# Patient Record
Sex: Female | Born: 1965
Health system: Southern US, Community
[De-identification: ages and names within clinical notes are randomized; demographics above are authoritative.]

---

## 2001-01-22 ENCOUNTER — Emergency Department (HOSPITAL_COMMUNITY): Admission: EM | Admit: 2001-01-22 | Discharge: 2001-01-22 | Payer: Self-pay | Admitting: Emergency Medicine

## 2006-04-26 ENCOUNTER — Other Ambulatory Visit: Admission: RE | Admit: 2006-04-26 | Discharge: 2006-04-26 | Payer: Self-pay | Admitting: Family Medicine

## 2007-04-13 ENCOUNTER — Emergency Department (HOSPITAL_COMMUNITY): Admission: EM | Admit: 2007-04-13 | Discharge: 2007-04-13 | Payer: Self-pay | Admitting: Emergency Medicine

## 2007-06-09 ENCOUNTER — Other Ambulatory Visit: Admission: RE | Admit: 2007-06-09 | Discharge: 2007-06-09 | Payer: Self-pay | Admitting: Family Medicine

## 2007-09-30 ENCOUNTER — Ambulatory Visit (HOSPITAL_COMMUNITY): Admission: RE | Admit: 2007-09-30 | Discharge: 2007-09-30 | Payer: Self-pay | Admitting: Obstetrics and Gynecology

## 2008-05-25 ENCOUNTER — Inpatient Hospital Stay (HOSPITAL_COMMUNITY): Admission: RE | Admit: 2008-05-25 | Discharge: 2008-05-27 | Payer: Self-pay | Admitting: Obstetrics and Gynecology

## 2009-10-02 ENCOUNTER — Emergency Department (HOSPITAL_COMMUNITY): Admission: EM | Admit: 2009-10-02 | Discharge: 2009-10-02 | Payer: Self-pay | Admitting: Emergency Medicine

## 2010-11-23 LAB — CBC
HCT: 38.4 % (ref 36.0–46.0)
Hemoglobin: 12.9 g/dL (ref 12.0–15.0)
MCHC: 33.6 g/dL (ref 30.0–36.0)
MCV: 90.5 fL (ref 78.0–100.0)
RBC: 4.24 MIL/uL (ref 3.87–5.11)
WBC: 4.5 10*3/uL (ref 4.0–10.5)

## 2010-11-23 LAB — POCT I-STAT, CHEM 8
Calcium, Ion: 1.08 mmol/L — ABNORMAL LOW (ref 1.12–1.32)
Hemoglobin: 14.3 g/dL (ref 12.0–15.0)
TCO2: 28 mmol/L (ref 0–100)

## 2010-11-23 LAB — DIFFERENTIAL
Lymphocytes Relative: 34 % (ref 12–46)
Monocytes Absolute: 0.4 10*3/uL (ref 0.1–1.0)
Monocytes Relative: 8 % (ref 3–12)
Neutro Abs: 2.5 10*3/uL (ref 1.7–7.7)

## 2010-11-23 LAB — POCT CARDIAC MARKERS
CKMB, poc: <1 ng/mL — ABNORMAL LOW (ref 1.0–8.0)
Myoglobin, poc: 65.4 ng/mL (ref 12–200)
Troponin i, poc: <0.05 ng/mL (ref 0.00–0.09)

## 2011-01-20 NOTE — H&P (Signed)
Melinda Graham, RYBACK                 ACCOUNT NO.:  [id number]   MEDICAL RECORD NO.:  [id number]        PATIENT TYPE:  WINP   LOCATION:                                FACILITY:  [facility]   PHYSICIAN:  Sherron Monday, MD        DATE OF BIRTH:  1965-12-28   DATE OF ADMISSION:  05/25/2008  DATE OF DISCHARGE:                              HISTORY & PHYSICAL   ADMITTING DIAGNOSES:  Intrauterine pregnancy at term, favorable cervix,  advanced maternal age.   PROCEDURES:  Induction of labor.   HISTORY OF PRESENT ILLNESS:  A 45 year old Caucasian G1, P0 at 58 and 3  with poor induction of labor secondary to favorable cervix.  She has  good fetal movements and loss of fluid.  No vaginal bleeding and  occasional contractions.  Her cervix in the office on May 23, 2008, was 4 cm dilated 50% effacement, and -2 station.  Her prenatal  care has been relatively uncomplicated.   PAST MEDICAL HISTORY:  Significant for situational anxiety.   PAST SURGICAL HISTORY:  Significant for LEEP and a breast augmentation  and also tummy tuck.   PAST OB/GYN HISTORY:  G1 is present pregnancy.  She has a history of an  abnormal Pap smear and colpo and LEAP 15 years ago also colposcopy in  2008 with biopsy.  She has also has a history of herpes and has been  getting suppression in her pregnancy.   MEDICATIONS:  Include prenatal vitamins.   ALLERGIES:  No known drug allergies.   SOCIAL HISTORY:  The patient denies tobacco, alcohol , or drug use; has  in the past been an alcohol user.   FAMILY HISTORY:  Significant for hypertension in her sister and diabetes  in maternal grandfather, maternal uncle, and maternal great uncle as  well as thyroid dysfunction in her sister.   PRENATAL LABS:  Hemoglobin 14.0 and platelets 200,000.  She is A  positive.  Antibody screen negative.  Group B strep in her urine.  AFB  seems to be normal.  Gonorrhea negative.  Chlamydia negative.  Herpes  positive for type 1 and 2,  RPR  nonreactive.  Rubella immune.  Cystic  fibrosis screen negative.  Hepatitis C surface antigen negative.  HIV  screen was negative.  First trimester screening within limits with a  normal nipple fold.  A 18-week scan revealed shadow on the baby's  heart therefore she had an abnormal fetal echo.  Ultrasound in her  prenatal care at 18 weeks revealed or confirmed an [facility] of May 29, 2008, normal anatomy and a anterior placenta with the normal anatomy, it  was noted that an echogenic band was seen in the left ventricle.  She  was advised to get an fetal echo, which was performed without difficulty  and revealed normal anatomy on Jan 10, 2008.  On fundus scan had a  slightly short cervix, mild funneling.  She had a fetal fibronectin and  the cervical links and was on pelvic rest following this.  Fetal  fibronectin was positive.   PHYSICAL EXAMINATION:  VITAL SIGNS:  Afebrile. Vital signs stable.  GENERAL:  No apparent distress.  CARDIOVASCULAR:  Regular rate and rhythm.  LUNGS:  Clear to auscultation bilaterally.  ABDOMEN:  Soft, nontender, and fundus nontender.  CERVIX:  Asymmetric and nontender.  Fetal heart tones seen in the office  were 140s.  Vaginal exam is 4, 58, and -2.   ASSESSMENT AND PLAN:  A 44 year old G1 admitted for induction of labor  after discussing risks, benefits, and alternatives, wishes to proceed.  We will rupture her membrane, and then start her on Pitocin.      Sherron Monday, MD  Electronically Signed     JB/MEDQ  D:  05/24/2008  T:  05/25/2008  Job:  [id number]

## 2011-01-20 NOTE — Discharge Summary (Signed)
Melinda Graham, SALMI                 ACCOUNT NO.:  [id number]   MEDICAL RECORD NO.:  [id number]          PATIENT TYPE:  INP   LOCATION:  9120                          FACILITY:  [facility]   PHYSICIAN:  Sherron Monday, MD        DATE OF BIRTH:  1966/06/23   DATE OF ADMISSION:  05/25/2008  DATE OF DISCHARGE:  05/27/2008                               DISCHARGE SUMMARY   ADMITTING DIAGNOSES:  Intrauterine pregnancy at term, favorable cervix,  advanced maternal age.   DISCHARGE DIAGNOSIS:  Intrauterine pregnancy at term, favorable cervix,  advanced maternal age, delivered via spontaneous vaginal delivery.   HISTORY OF PRESENT ILLNESS:  A 45 year old G1, P0 at 46 and 3/7 weeks  admitted for induction of labor secondary to favorable cervix.  She has  had good fetal movement and no loss of fluid, no vaginal bleeding, and  occasional contractions.  Presents to the office on May 23, 2008,  was 4-cm dilation, 50% effaced, and -2 station.  Her prenatal care has  been relatively uncomplicated.   PAST MEDICAL HISTORY:  Significant for situational anxiety.   PAST SURGICAL HISTORY:  Significant for LEEP, breast augmentation, and  tummy tuck.   PAST OB/GYN HISTORY:  G1 is the present pregnancy, history of abnormal  Pap smear, colpo and LEEP 15 years ago with a nonspecific colposcopy in  2008, with biopsy and a history of herpes and has been getting  suppression with her pregnancy.   MEDICATIONS:  Prenatal vitamins.   ALLERGIES:  No known drug allergies.   SOCIAL HISTORY:  The patient denies alcohol, tobacco, or drug use but  has in the past been an alcohol user.   FAMILY HISTORY:  Significant for hypertension in sister, and diabetes in  maternal grandfather, maternal uncle, and maternal great uncle as well  as thyroid dysfunction in her sister.   PRENATAL LABORATORY:  Hemoglobin 14, platelets 200,000. A positive,  antibody screen negative.  Group B strep negative.  Antibody AFP is  normal.   Gonorrhea negative.  Chlamydia negative.  Herpes, she is  positive for type 1 and 2.  RPR nonreactive.  Rubella immune.  Cystic  fibrosis screen negative.  Hepatitis B surface antigen negative.  HIV is  negative.  First trimester screen within normal limits and normal nuchal  fold.   Ultrasound 18 weeks revealed shadow on the baby's heart, therefore she  had a fetal echo which was found to be normal.  The 18-week scanning  confirmed an [facility] of May 29, 2008, normal anatomy anterior  placenta.  On a  4-D ultrasound, she has a slightly shortened cervix and  moderate funneling and has a fetal fibronectin test.  She is put on  pelvic rest, fetal fibronectin was positive.  However, there were no  problems until this point.   On admission today afebrile, vital signs stable with a very benign exam.  Once she was admitted, she was 4-cm dilated and 50% effaced and -2  station. She was admitted for labor, started on Pitocin foraugmentation.  She received her penicillin, rupture of membranes was  performed for  clear fluid.  She progressed to complete, complete and +2-3, pushed well  for about 10-15 minutes to deliver a viable female infant at 21:15 with  Apgars of 9 and 9.  Infant delivered from OA into LOT weight of 8 pounds  1 ounce.  Second degree perineal laceration and right labial laceration  were repaired with 3-0 Vicryl in typical fashion.  Placenta was  expressed intact at 21:21.  Her postpartum course was relatively  uncomplicated.  She remained afebrile with stable vital signs  throughout.  Her hemoglobin decreased from 13.8 to 11.6 which she is  tolerating well.  She was discharged to home on postpartum day 2 with  her pain being well controlled, normal lochia and no problems with  ambulation.  She was discharged to home with Motrin, Vicodin, and  prenatal vitamins.  She will follow up in approximately 6 weeks.  She is  given routine discharge instructions and numbers to call if  any  questions or problems.  She plans to breastfeed.  She is A positive,  rubella immune.  Hemoglobin decreased from 13.8 to a 11.6, and we will  discuss contraception at her 6-week checkup.      Sherron Monday, MD  Electronically Signed     JB/MEDQ  D:  05/27/2008  T:  05/28/2008  Job:  [id number]   cc:   Malva Limes, M.D.  Fax: [phone]

## 2011-02-03 IMAGING — CR DG CHEST 2V
2 series · 2 of 2 positions shown · non-contrast
Comparison: None.

CLINICAL DATA: Chest pressure.

CHEST - 2 VIEW

[w chest pa]
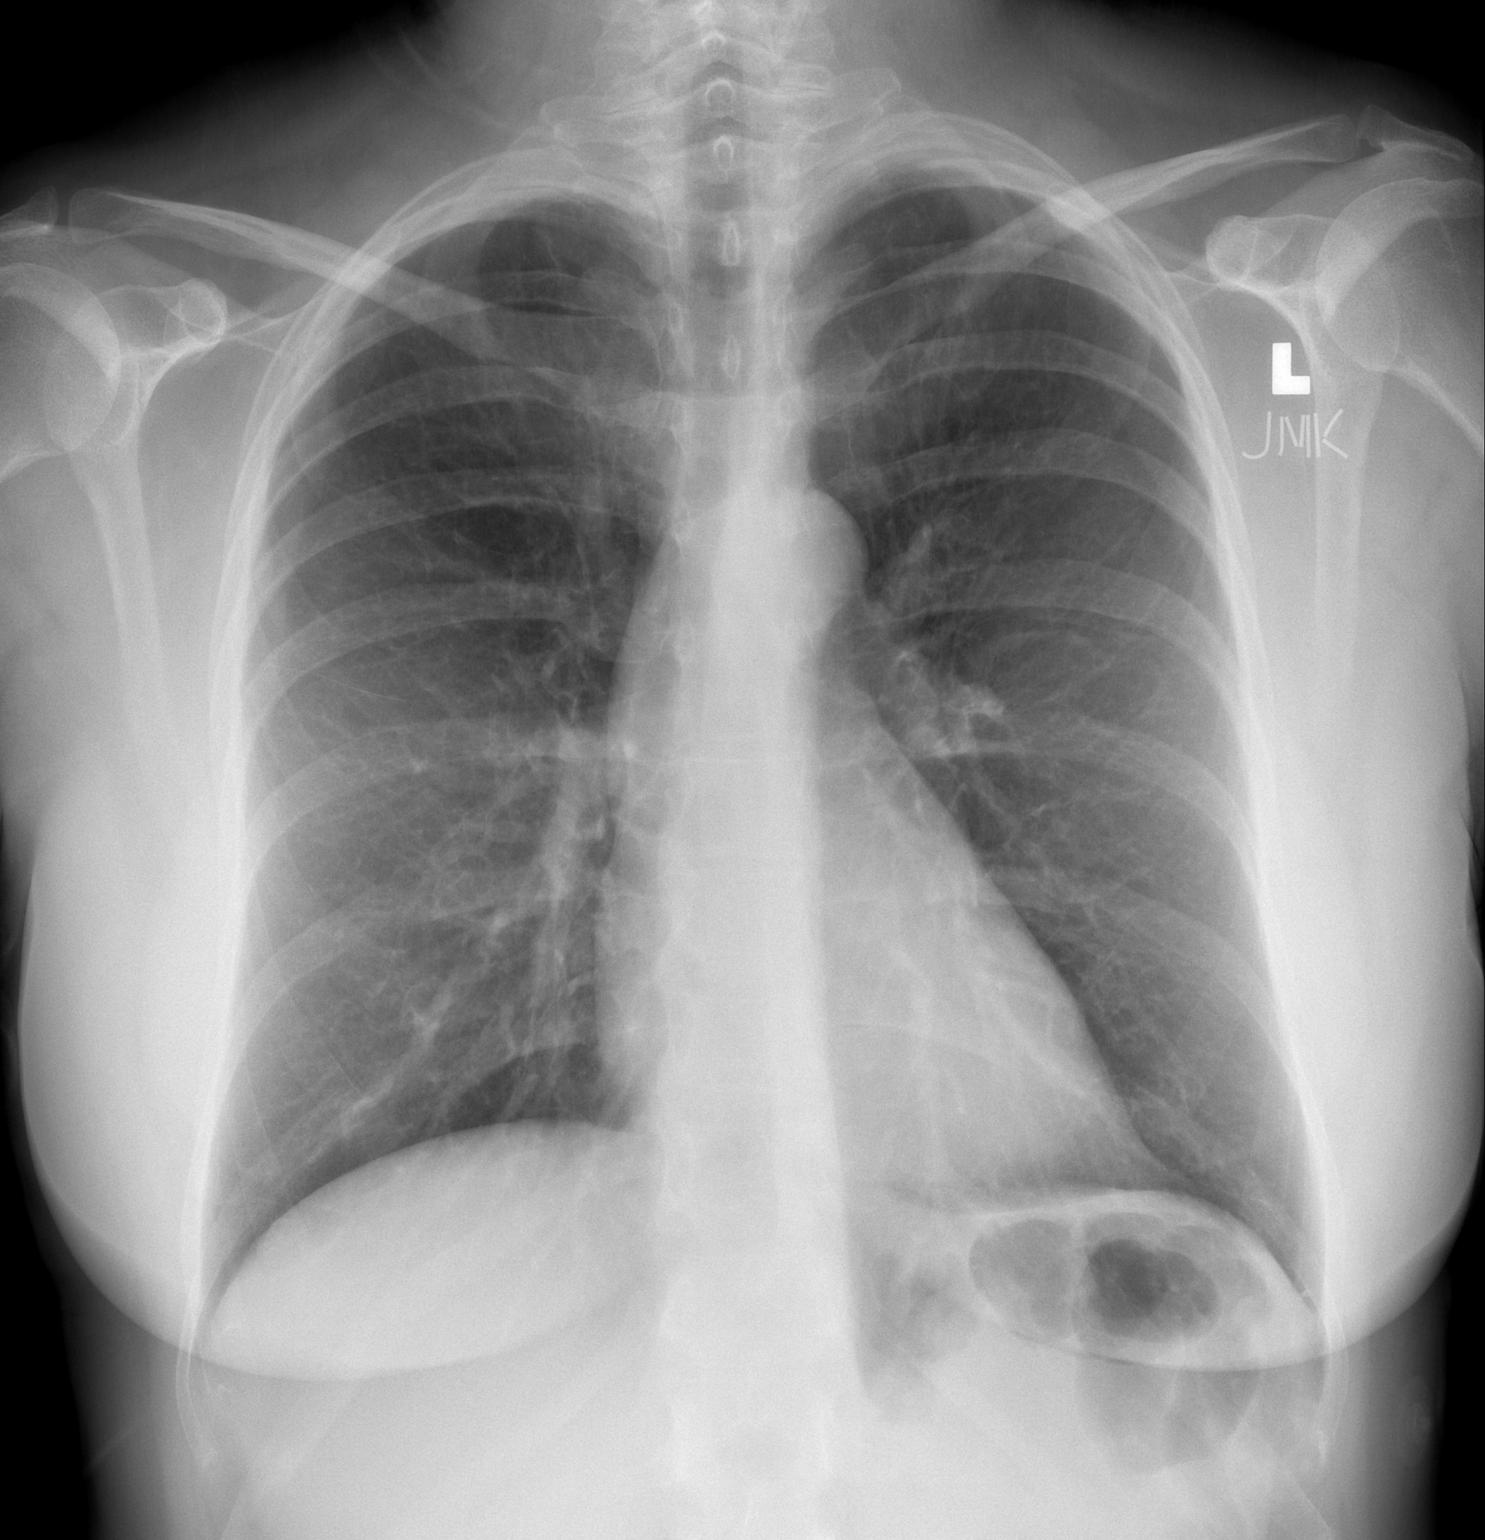

[w chest lat]
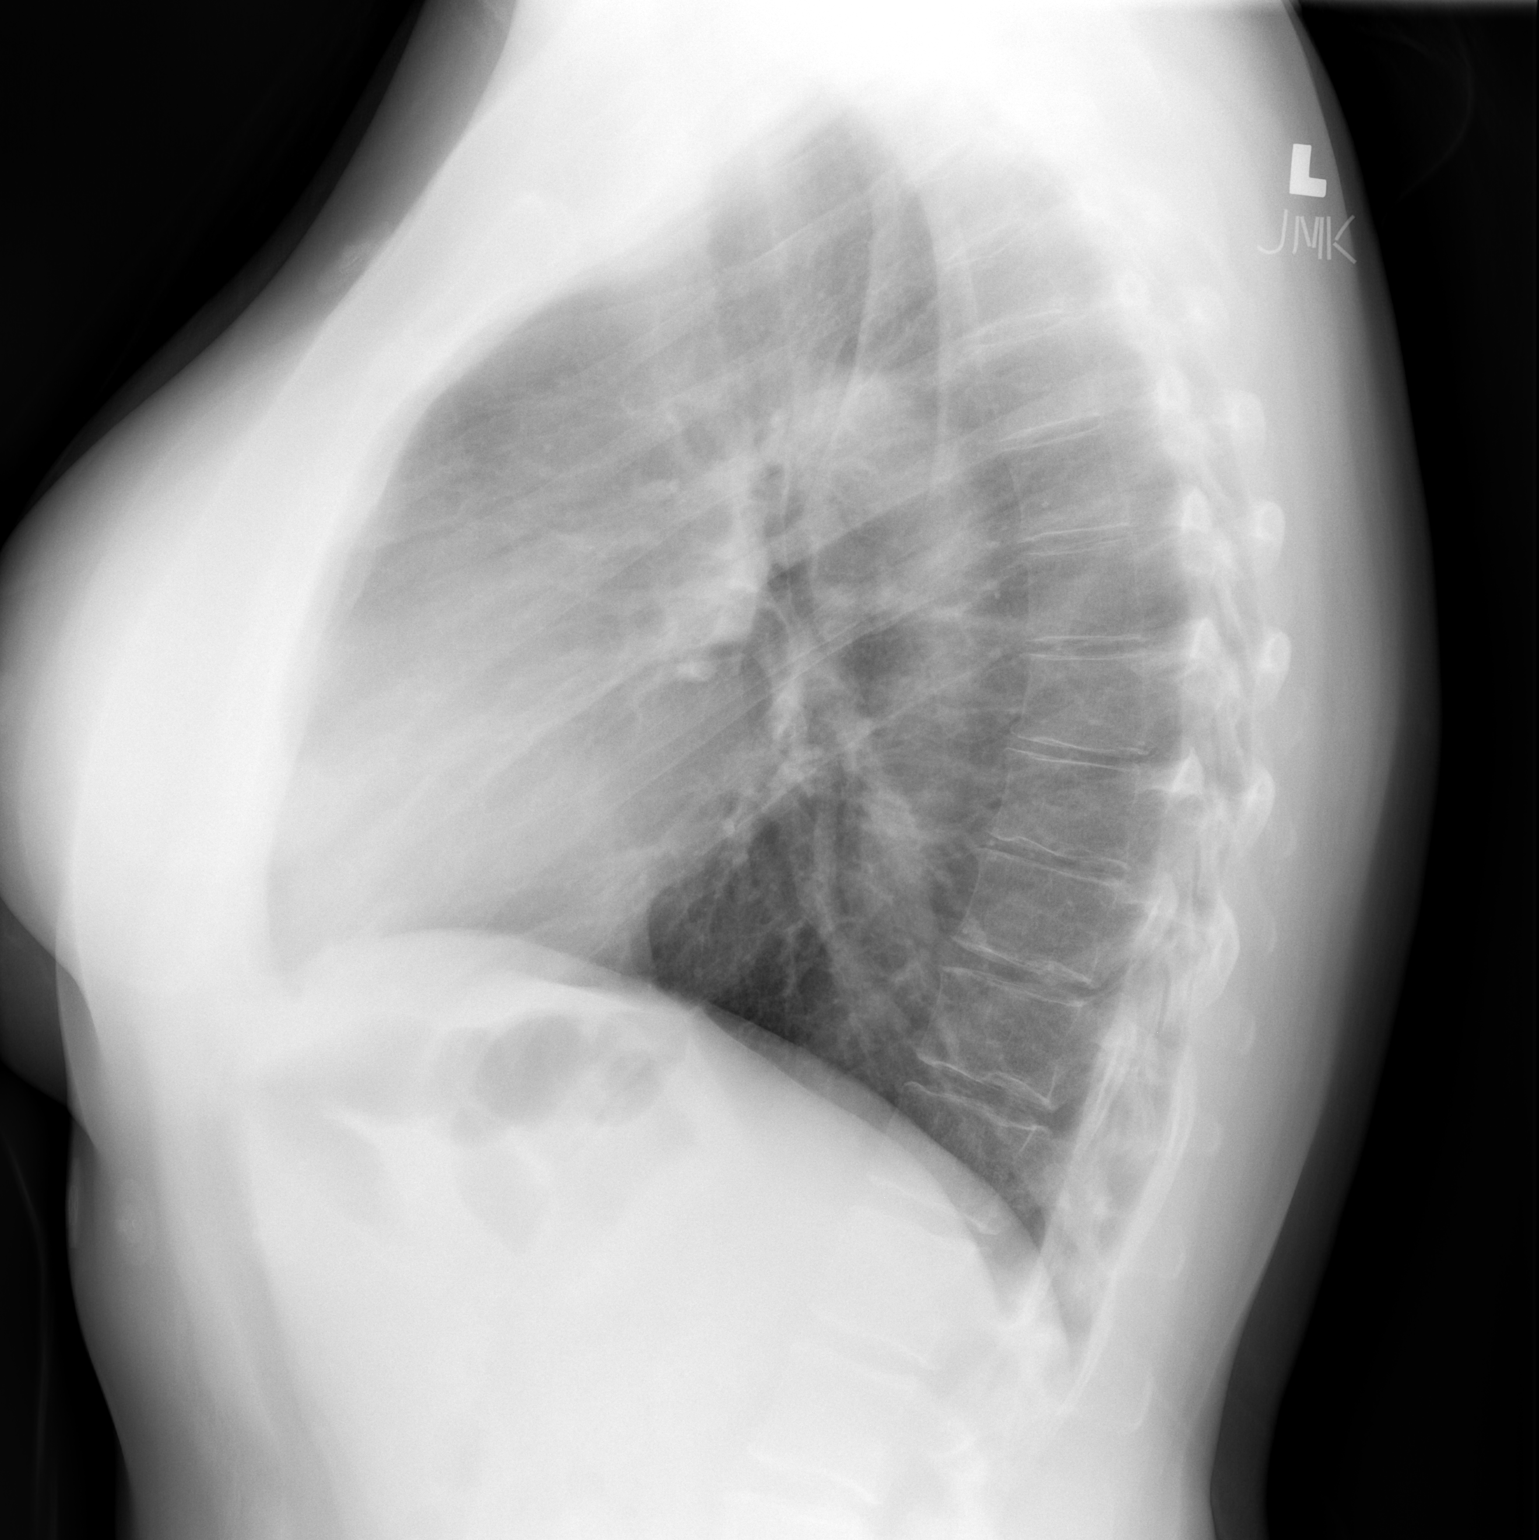

[2 of 2 positions shown; findings below may reference images not displayed]

FINDINGS: Heart size is normal.  There is no heart failure.  There
is no infiltrate or effusion.  The lungs are clear.
IMPRESSION: No acute cardiopulmonary disease.

## 2011-04-07 ENCOUNTER — Emergency Department (HOSPITAL_COMMUNITY)
Admission: EM | Admit: 2011-04-07 | Discharge: 2011-04-07 | Disposition: A | Discharge status: Home / Self Care | Payer: BC Managed Care – PPO | Attending: Emergency Medicine | Admitting: Emergency Medicine

## 2011-04-07 ENCOUNTER — Emergency Department (HOSPITAL_COMMUNITY): Payer: BC Managed Care – PPO

## 2011-04-07 DIAGNOSIS — K2970 Gastritis, unspecified, without bleeding: Secondary | ICD-10-CM | POA: Insufficient documentation

## 2011-04-07 DIAGNOSIS — N390 Urinary tract infection, site not specified: Secondary | ICD-10-CM | POA: Insufficient documentation

## 2011-04-07 DIAGNOSIS — R10816 Epigastric abdominal tenderness: Secondary | ICD-10-CM | POA: Insufficient documentation

## 2011-04-07 DIAGNOSIS — R1013 Epigastric pain: Secondary | ICD-10-CM | POA: Insufficient documentation

## 2011-04-07 LAB — COMPREHENSIVE METABOLIC PANEL
ALT: 20 U/L (ref 0–35)
AST: 41 U/L — ABNORMAL HIGH (ref 0–37)
Alkaline Phosphatase: 72 U/L (ref 39–117)
Creatinine, Ser: 0.58 mg/dL (ref 0.50–1.10)
GFR calc non Af Amer: >60 mL/min (ref >60)
Potassium: 3.8 mEq/L (ref 3.5–5.1)
Sodium: 137 mEq/L (ref 135–145)
Total Bilirubin: 0.4 mg/dL (ref 0.3–1.2)

## 2011-04-07 LAB — URINE MICROSCOPIC-ADD ON

## 2011-04-07 LAB — DIFFERENTIAL
Basophils Absolute: 0 10*3/uL (ref 0.0–0.1)
Basophils Relative: 0 % (ref 0–1)
Eosinophils Absolute: 0.1 10*3/uL (ref 0.0–0.7)
Eosinophils Relative: 1 % (ref 0–5)
Lymphocytes Relative: 23 % (ref 12–46)
Lymphs Abs: 1.8 10*3/uL (ref 0.7–4.0)
Monocytes Absolute: 0.6 10*3/uL (ref 0.1–1.0)
Monocytes Relative: 8 % (ref 3–12)
Neutro Abs: 5.5 10*3/uL (ref 1.7–7.7)
Neutrophils Relative %: 68 % (ref 43–77)

## 2011-04-07 LAB — URINALYSIS, ROUTINE W REFLEX MICROSCOPIC
Glucose, UA: NEGATIVE mg/dL
Ketones, ur: 15 mg/dL — AB
Nitrite: POSITIVE — AB
Protein, ur: 30 mg/dL — AB
Specific Gravity, Urine: 1.014 (ref 1.005–1.030)
Urobilinogen, UA: 0.2 mg/dL (ref 0.0–1.0)
pH: 6.5 (ref 5.0–8.0)

## 2011-04-07 LAB — CBC
HCT: 39.3 % (ref 36.0–46.0)
Platelets: 194 10*3/uL (ref 150–400)
RDW: 17 % — ABNORMAL HIGH (ref 11.5–15.5)
WBC: 8.1 10*3/uL (ref 4.0–10.5)

## 2011-04-07 LAB — LIPASE, BLOOD: Lipase: 50 U/L (ref 11–59)

## 2011-06-08 LAB — CBC
HCT: 33.8 — ABNORMAL LOW
HCT: 40.4
Hemoglobin: 11.6 — ABNORMAL LOW
Hemoglobin: 13.8
MCV: 104.1 — ABNORMAL HIGH
MCV: 104.2 — ABNORMAL HIGH
RBC: 3.88
RDW: 13.4
WBC: 8.6

## 2011-06-08 LAB — CORD BLOOD PRIVATE-SAMPLE DRAW

## 2011-06-08 LAB — RPR: RPR Ser Ql: NONREACTIVE

## 2011-06-22 LAB — RAPID URINE DRUG SCREEN, HOSP PERFORMED
Barbiturates: NOT DETECTED
Benzodiazepines: NOT DETECTED
Cocaine: NOT DETECTED
Opiates: NOT DETECTED

## 2011-06-22 LAB — DIFFERENTIAL
Basophils Absolute: 0
Eosinophils Absolute: 0.1
Eosinophils Relative: 1
Lymphocytes Relative: 23
Neutrophils Relative %: 57

## 2011-06-22 LAB — ETHANOL: Alcohol, Ethyl (B): <5

## 2011-06-22 LAB — CBC
Hemoglobin: 15.2 — ABNORMAL HIGH
MCHC: 34.3
Platelets: 119 — ABNORMAL LOW
RDW: 12.9

## 2011-06-22 LAB — URINALYSIS, ROUTINE W REFLEX MICROSCOPIC
Bilirubin Urine: NEGATIVE
Glucose, UA: NEGATIVE
Ketones, ur: 15 — AB
Protein, ur: NEGATIVE
pH: 8.5 — ABNORMAL HIGH

## 2011-06-22 LAB — URINE MICROSCOPIC-ADD ON

## 2011-06-22 LAB — I-STAT 8, (EC8 V) (CONVERTED LAB)
Acid-Base Excess: 6 — ABNORMAL HIGH
HCT: 48 — ABNORMAL HIGH
Hemoglobin: 16.3 — ABNORMAL HIGH
Operator id: 282201
Potassium: 4.2
Sodium: 134 — ABNORMAL LOW
TCO2: 33
pH, Ven: 7.451 — ABNORMAL HIGH

## 2011-06-22 LAB — HEPATIC FUNCTION PANEL
ALT: 81 — ABNORMAL HIGH
Alkaline Phosphatase: 72
Bilirubin, Direct: 0.3
Indirect Bilirubin: 1.1 — ABNORMAL HIGH

## 2011-06-22 LAB — URINE CULTURE

## 2012-08-08 IMAGING — US US ABDOMEN COMPLETE
1 series · 14 of 25 positions shown · non-contrast
Comparison: 12/16/2006 exam from [HOSPITAL]

CLINICAL DATA: Abdominal pain. Elevated liver function tests.

ABDOMINAL ULTRASOUND COMPLETE

[Series 1: us abdomen complete · 0.25mm/px · 14 of 59 slices shown]
[im 1/59]
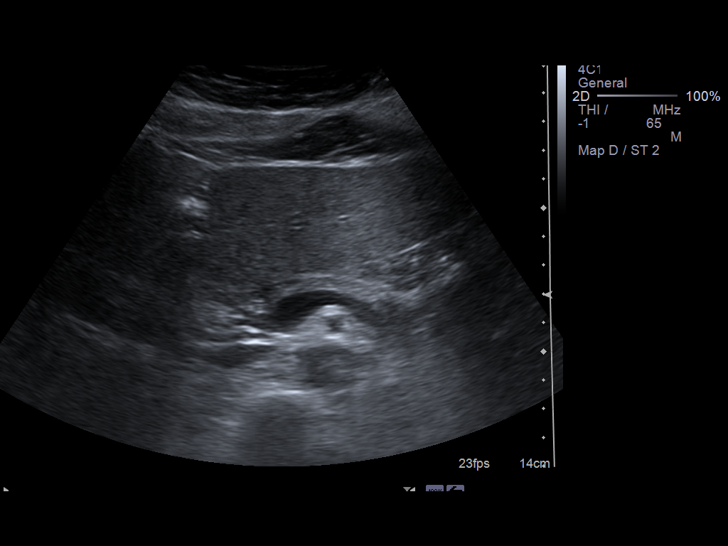
[im 5/59]
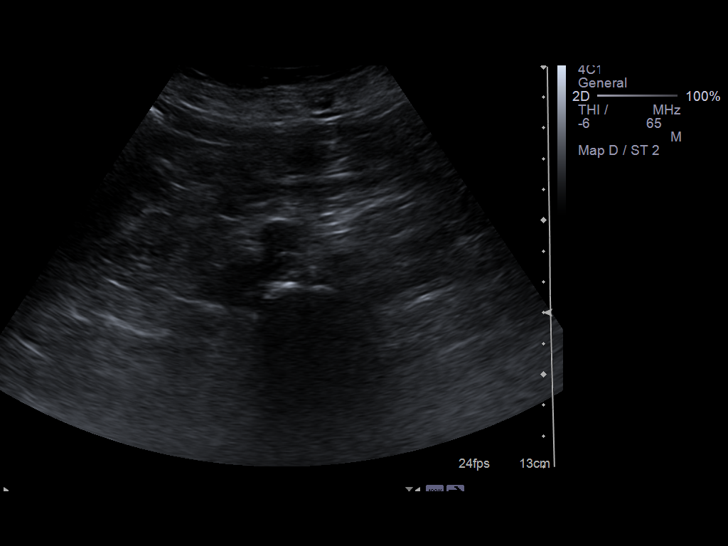
[im 10/59]
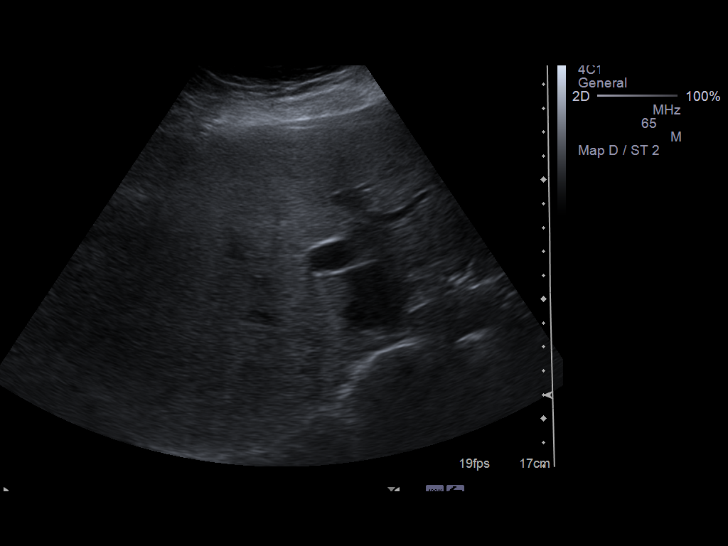
[im 15/59]
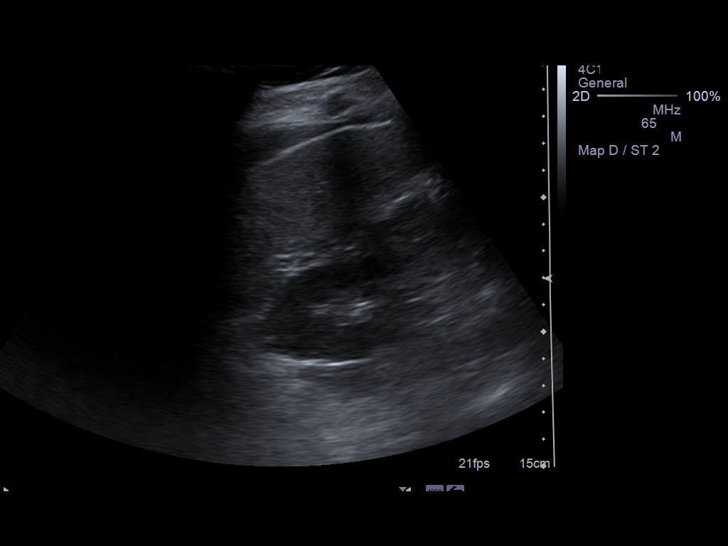
[im 20/59]
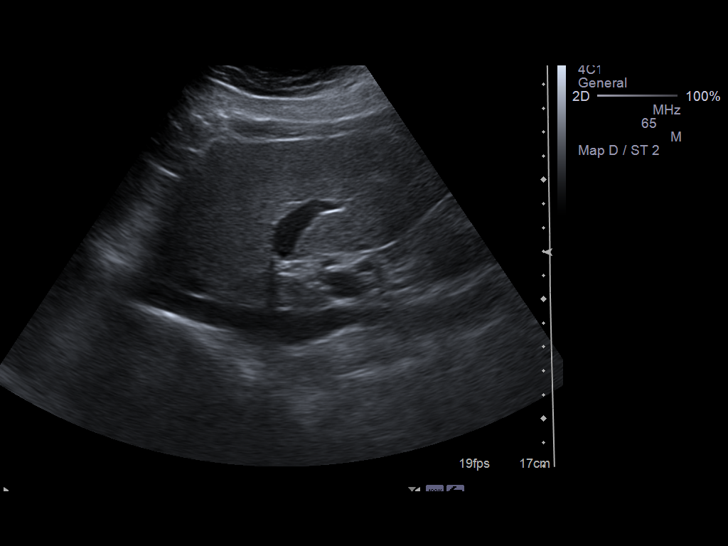
[im 22/59]
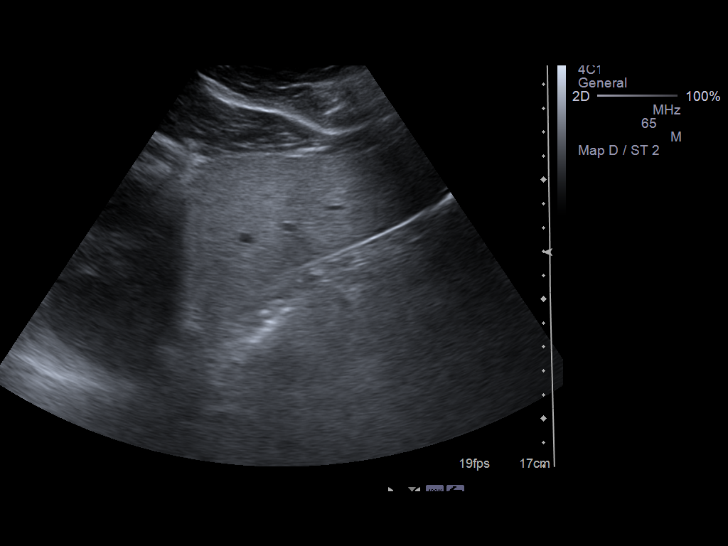
[im 27/59]
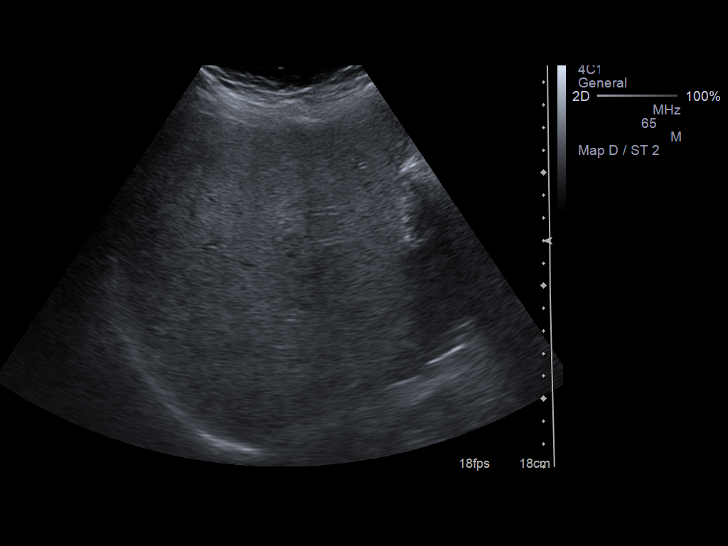
[im 32/59]
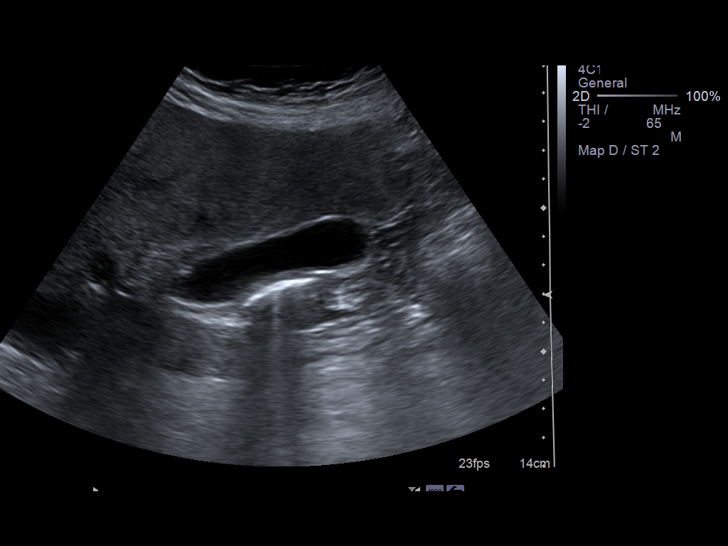
[im 37/59]
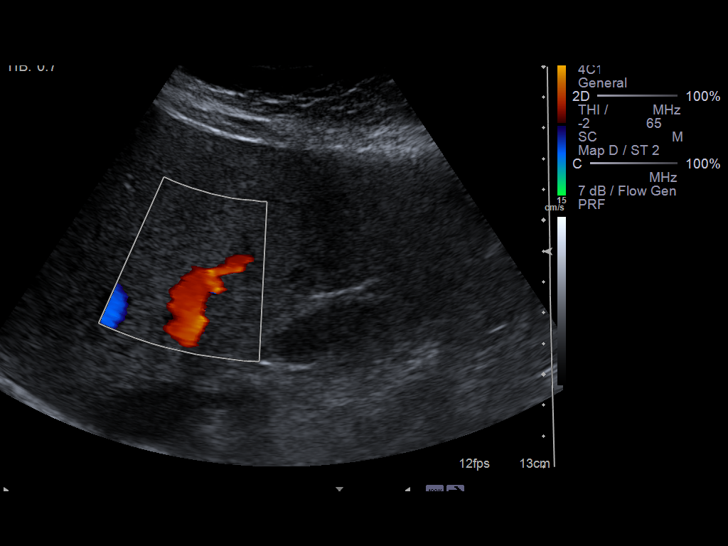
[im 39/59]
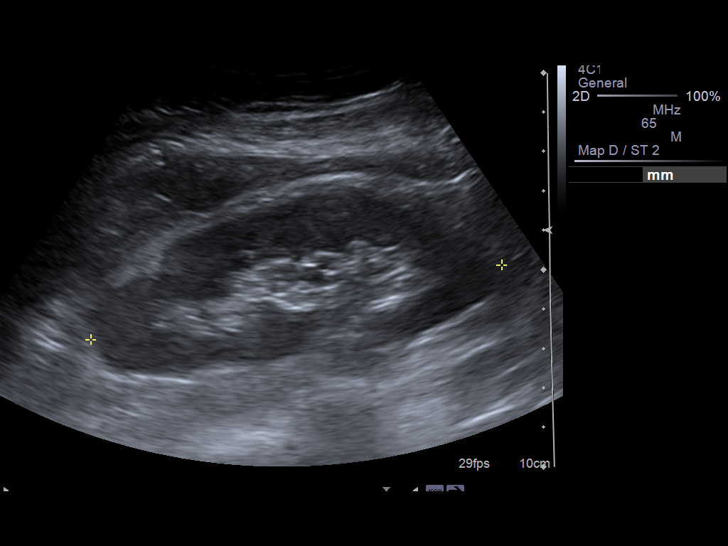
[im 44/59]
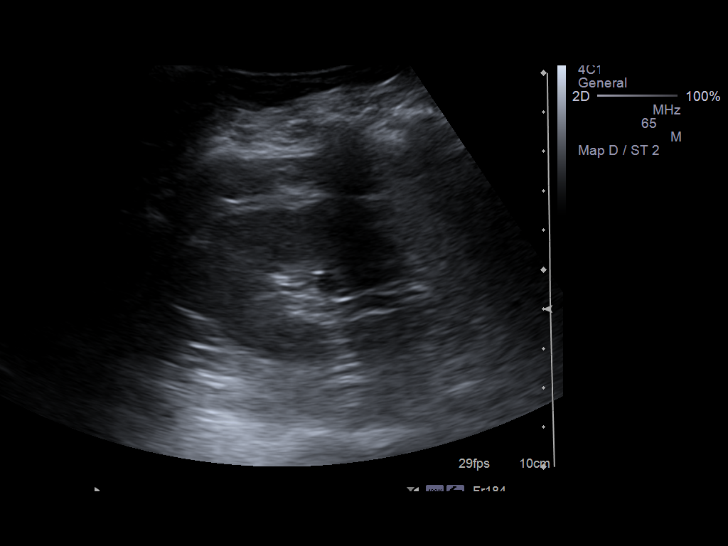
[im 49/59]
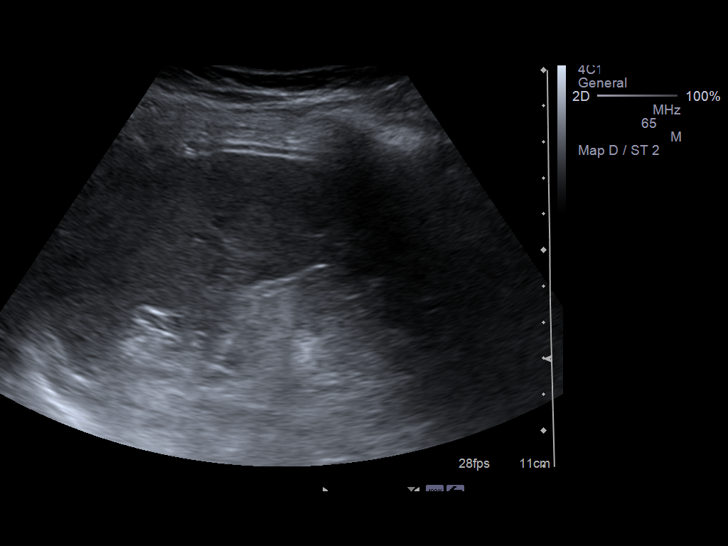
[im 54/59]
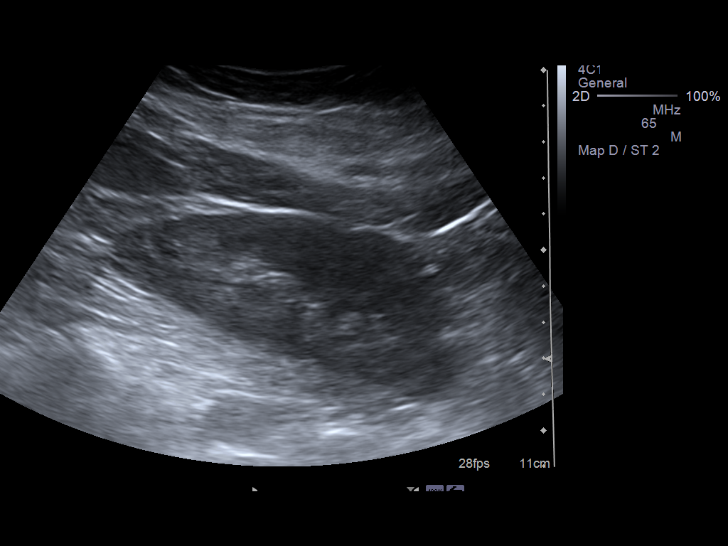
[im 59/59]
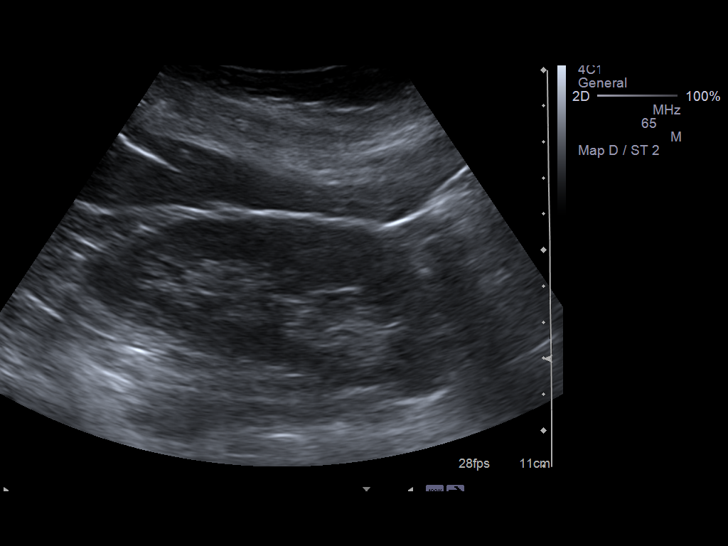

[14 of 25 positions shown; findings below may reference images not displayed]

FINDINGS: Gallbladder:  No gallstones, gallbladder wall thickening, or
pericholecystic fluid.

Common Bile Duct:  Within normal limits in caliber. Measures 2 mm
in diameter.

Liver: Diffusely increased parenchymal echogenicity, consistent
with hepatic steatosis.  No focal liver mass identified.

IVC:  Appears normal.

Pancreas:  No abnormality identified.

Spleen:  Within normal limits in size and echotexture.

Right kidney:  Normal in size and parenchymal echogenicity.  No
evidence of mass or hydronephrosis.

Left kidney:  Normal in size and parenchymal echogenicity.  No
evidence of mass or hydronephrosis.

Abdominal Aorta:  No aneurysm identified.
IMPRESSION: 1.  No evidence of cholelithiasis, biliary dilatation, or other
acute findings.
2.  Hepatic steatosis, without significant change since prior exam.

## 2016-03-26 DIAGNOSIS — R1084 Generalized abdominal pain: Secondary | ICD-10-CM | POA: Diagnosis not present

## 2016-03-26 DIAGNOSIS — F419 Anxiety disorder, unspecified: Secondary | ICD-10-CM | POA: Diagnosis not present

## 2016-03-26 DIAGNOSIS — G4700 Insomnia, unspecified: Secondary | ICD-10-CM | POA: Diagnosis not present

## 2016-03-26 DIAGNOSIS — M545 Low back pain: Secondary | ICD-10-CM | POA: Diagnosis not present

## 2016-03-26 DIAGNOSIS — Z0000 Encounter for general adult medical examination without abnormal findings: Secondary | ICD-10-CM | POA: Diagnosis not present

## 2016-03-27 DIAGNOSIS — Z1389 Encounter for screening for other disorder: Secondary | ICD-10-CM | POA: Diagnosis not present

## 2016-03-27 DIAGNOSIS — N959 Unspecified menopausal and perimenopausal disorder: Secondary | ICD-10-CM | POA: Diagnosis not present

## 2016-03-27 DIAGNOSIS — Z113 Encounter for screening for infections with a predominantly sexual mode of transmission: Secondary | ICD-10-CM | POA: Diagnosis not present

## 2016-03-27 DIAGNOSIS — Z1231 Encounter for screening mammogram for malignant neoplasm of breast: Secondary | ICD-10-CM | POA: Diagnosis not present

## 2016-03-27 DIAGNOSIS — N393 Stress incontinence (female) (male): Secondary | ICD-10-CM | POA: Diagnosis not present

## 2016-03-27 DIAGNOSIS — Z124 Encounter for screening for malignant neoplasm of cervix: Secondary | ICD-10-CM | POA: Diagnosis not present

## 2016-03-27 DIAGNOSIS — Z01419 Encounter for gynecological examination (general) (routine) without abnormal findings: Secondary | ICD-10-CM | POA: Diagnosis not present

## 2016-03-27 DIAGNOSIS — Z1151 Encounter for screening for human papillomavirus (HPV): Secondary | ICD-10-CM | POA: Diagnosis not present

## 2016-03-27 DIAGNOSIS — Z6826 Body mass index (BMI) 26.0-26.9, adult: Secondary | ICD-10-CM | POA: Diagnosis not present

## 2016-05-18 DIAGNOSIS — M545 Low back pain: Secondary | ICD-10-CM | POA: Diagnosis not present

## 2016-05-18 DIAGNOSIS — Z23 Encounter for immunization: Secondary | ICD-10-CM | POA: Diagnosis not present

## 2016-05-18 DIAGNOSIS — F419 Anxiety disorder, unspecified: Secondary | ICD-10-CM | POA: Diagnosis not present

## 2016-05-27 DIAGNOSIS — M6281 Muscle weakness (generalized): Secondary | ICD-10-CM | POA: Diagnosis not present

## 2016-05-27 DIAGNOSIS — M546 Pain in thoracic spine: Secondary | ICD-10-CM | POA: Diagnosis not present

## 2016-05-27 DIAGNOSIS — M2560 Stiffness of unspecified joint, not elsewhere classified: Secondary | ICD-10-CM | POA: Diagnosis not present

## 2016-05-27 DIAGNOSIS — M545 Low back pain: Secondary | ICD-10-CM | POA: Diagnosis not present

## 2016-05-29 DIAGNOSIS — M6281 Muscle weakness (generalized): Secondary | ICD-10-CM | POA: Diagnosis not present

## 2016-05-29 DIAGNOSIS — M2560 Stiffness of unspecified joint, not elsewhere classified: Secondary | ICD-10-CM | POA: Diagnosis not present

## 2016-05-29 DIAGNOSIS — M546 Pain in thoracic spine: Secondary | ICD-10-CM | POA: Diagnosis not present

## 2016-05-29 DIAGNOSIS — M545 Low back pain: Secondary | ICD-10-CM | POA: Diagnosis not present

## 2016-06-02 DIAGNOSIS — M2560 Stiffness of unspecified joint, not elsewhere classified: Secondary | ICD-10-CM | POA: Diagnosis not present

## 2016-06-02 DIAGNOSIS — M545 Low back pain: Secondary | ICD-10-CM | POA: Diagnosis not present

## 2016-06-02 DIAGNOSIS — M6281 Muscle weakness (generalized): Secondary | ICD-10-CM | POA: Diagnosis not present

## 2016-06-02 DIAGNOSIS — M546 Pain in thoracic spine: Secondary | ICD-10-CM | POA: Diagnosis not present

## 2016-06-05 DIAGNOSIS — M546 Pain in thoracic spine: Secondary | ICD-10-CM | POA: Diagnosis not present

## 2016-06-05 DIAGNOSIS — M2560 Stiffness of unspecified joint, not elsewhere classified: Secondary | ICD-10-CM | POA: Diagnosis not present

## 2016-06-05 DIAGNOSIS — M6281 Muscle weakness (generalized): Secondary | ICD-10-CM | POA: Diagnosis not present

## 2016-06-05 DIAGNOSIS — M545 Low back pain: Secondary | ICD-10-CM | POA: Diagnosis not present

## 2016-06-09 DIAGNOSIS — M2560 Stiffness of unspecified joint, not elsewhere classified: Secondary | ICD-10-CM | POA: Diagnosis not present

## 2016-06-09 DIAGNOSIS — M546 Pain in thoracic spine: Secondary | ICD-10-CM | POA: Diagnosis not present

## 2016-06-09 DIAGNOSIS — M6281 Muscle weakness (generalized): Secondary | ICD-10-CM | POA: Diagnosis not present

## 2016-06-09 DIAGNOSIS — M545 Low back pain: Secondary | ICD-10-CM | POA: Diagnosis not present

## 2016-06-12 DIAGNOSIS — M546 Pain in thoracic spine: Secondary | ICD-10-CM | POA: Diagnosis not present

## 2016-06-12 DIAGNOSIS — M545 Low back pain: Secondary | ICD-10-CM | POA: Diagnosis not present

## 2016-06-12 DIAGNOSIS — M6281 Muscle weakness (generalized): Secondary | ICD-10-CM | POA: Diagnosis not present

## 2016-06-12 DIAGNOSIS — M2560 Stiffness of unspecified joint, not elsewhere classified: Secondary | ICD-10-CM | POA: Diagnosis not present

## 2016-06-19 DIAGNOSIS — M546 Pain in thoracic spine: Secondary | ICD-10-CM | POA: Diagnosis not present

## 2016-06-19 DIAGNOSIS — M545 Low back pain: Secondary | ICD-10-CM | POA: Diagnosis not present

## 2016-06-19 DIAGNOSIS — M6281 Muscle weakness (generalized): Secondary | ICD-10-CM | POA: Diagnosis not present

## 2016-06-19 DIAGNOSIS — M2560 Stiffness of unspecified joint, not elsewhere classified: Secondary | ICD-10-CM | POA: Diagnosis not present

## 2016-06-26 DIAGNOSIS — M546 Pain in thoracic spine: Secondary | ICD-10-CM | POA: Diagnosis not present

## 2016-06-26 DIAGNOSIS — M545 Low back pain: Secondary | ICD-10-CM | POA: Diagnosis not present

## 2016-06-26 DIAGNOSIS — M6281 Muscle weakness (generalized): Secondary | ICD-10-CM | POA: Diagnosis not present

## 2016-06-26 DIAGNOSIS — M2560 Stiffness of unspecified joint, not elsewhere classified: Secondary | ICD-10-CM | POA: Diagnosis not present

## 2016-07-02 DIAGNOSIS — M6281 Muscle weakness (generalized): Secondary | ICD-10-CM | POA: Diagnosis not present

## 2016-07-02 DIAGNOSIS — M545 Low back pain: Secondary | ICD-10-CM | POA: Diagnosis not present

## 2016-07-02 DIAGNOSIS — M546 Pain in thoracic spine: Secondary | ICD-10-CM | POA: Diagnosis not present

## 2016-07-02 DIAGNOSIS — M2560 Stiffness of unspecified joint, not elsewhere classified: Secondary | ICD-10-CM | POA: Diagnosis not present

## 2016-07-16 DIAGNOSIS — M6281 Muscle weakness (generalized): Secondary | ICD-10-CM | POA: Diagnosis not present

## 2016-07-16 DIAGNOSIS — M546 Pain in thoracic spine: Secondary | ICD-10-CM | POA: Diagnosis not present

## 2016-07-16 DIAGNOSIS — M545 Low back pain: Secondary | ICD-10-CM | POA: Diagnosis not present

## 2016-07-16 DIAGNOSIS — M2560 Stiffness of unspecified joint, not elsewhere classified: Secondary | ICD-10-CM | POA: Diagnosis not present

## 2016-07-23 DIAGNOSIS — M6281 Muscle weakness (generalized): Secondary | ICD-10-CM | POA: Diagnosis not present

## 2016-07-23 DIAGNOSIS — M545 Low back pain: Secondary | ICD-10-CM | POA: Diagnosis not present

## 2016-07-23 DIAGNOSIS — M2560 Stiffness of unspecified joint, not elsewhere classified: Secondary | ICD-10-CM | POA: Diagnosis not present

## 2016-07-23 DIAGNOSIS — M546 Pain in thoracic spine: Secondary | ICD-10-CM | POA: Diagnosis not present

## 2016-07-28 DIAGNOSIS — M545 Low back pain: Secondary | ICD-10-CM | POA: Diagnosis not present

## 2016-07-28 DIAGNOSIS — M2560 Stiffness of unspecified joint, not elsewhere classified: Secondary | ICD-10-CM | POA: Diagnosis not present

## 2016-07-28 DIAGNOSIS — M6281 Muscle weakness (generalized): Secondary | ICD-10-CM | POA: Diagnosis not present

## 2016-07-28 DIAGNOSIS — M546 Pain in thoracic spine: Secondary | ICD-10-CM | POA: Diagnosis not present

## 2016-08-04 DIAGNOSIS — M545 Low back pain: Secondary | ICD-10-CM | POA: Diagnosis not present

## 2016-08-04 DIAGNOSIS — M2560 Stiffness of unspecified joint, not elsewhere classified: Secondary | ICD-10-CM | POA: Diagnosis not present

## 2016-08-04 DIAGNOSIS — M546 Pain in thoracic spine: Secondary | ICD-10-CM | POA: Diagnosis not present

## 2016-08-04 DIAGNOSIS — M6281 Muscle weakness (generalized): Secondary | ICD-10-CM | POA: Diagnosis not present

## 2016-08-11 DIAGNOSIS — M2560 Stiffness of unspecified joint, not elsewhere classified: Secondary | ICD-10-CM | POA: Diagnosis not present

## 2016-08-11 DIAGNOSIS — M6281 Muscle weakness (generalized): Secondary | ICD-10-CM | POA: Diagnosis not present

## 2016-08-11 DIAGNOSIS — M545 Low back pain: Secondary | ICD-10-CM | POA: Diagnosis not present

## 2016-08-11 DIAGNOSIS — M546 Pain in thoracic spine: Secondary | ICD-10-CM | POA: Diagnosis not present

## 2016-08-13 DIAGNOSIS — M545 Low back pain: Secondary | ICD-10-CM | POA: Diagnosis not present

## 2016-08-18 DIAGNOSIS — M546 Pain in thoracic spine: Secondary | ICD-10-CM | POA: Diagnosis not present

## 2016-08-18 DIAGNOSIS — M545 Low back pain: Secondary | ICD-10-CM | POA: Diagnosis not present

## 2016-08-18 DIAGNOSIS — M6281 Muscle weakness (generalized): Secondary | ICD-10-CM | POA: Diagnosis not present

## 2016-08-18 DIAGNOSIS — M2560 Stiffness of unspecified joint, not elsewhere classified: Secondary | ICD-10-CM | POA: Diagnosis not present

## 2016-08-25 DIAGNOSIS — M2560 Stiffness of unspecified joint, not elsewhere classified: Secondary | ICD-10-CM | POA: Diagnosis not present

## 2016-08-25 DIAGNOSIS — M546 Pain in thoracic spine: Secondary | ICD-10-CM | POA: Diagnosis not present

## 2016-08-25 DIAGNOSIS — M545 Low back pain: Secondary | ICD-10-CM | POA: Diagnosis not present

## 2016-08-25 DIAGNOSIS — M6281 Muscle weakness (generalized): Secondary | ICD-10-CM | POA: Diagnosis not present

## 2016-09-04 DIAGNOSIS — M545 Low back pain: Secondary | ICD-10-CM | POA: Diagnosis not present

## 2016-09-04 DIAGNOSIS — M6281 Muscle weakness (generalized): Secondary | ICD-10-CM | POA: Diagnosis not present

## 2016-09-04 DIAGNOSIS — M2560 Stiffness of unspecified joint, not elsewhere classified: Secondary | ICD-10-CM | POA: Diagnosis not present

## 2016-09-04 DIAGNOSIS — M546 Pain in thoracic spine: Secondary | ICD-10-CM | POA: Diagnosis not present

## 2016-09-10 DIAGNOSIS — M545 Low back pain: Secondary | ICD-10-CM | POA: Diagnosis not present

## 2016-09-10 DIAGNOSIS — M546 Pain in thoracic spine: Secondary | ICD-10-CM | POA: Diagnosis not present

## 2016-09-10 DIAGNOSIS — M2560 Stiffness of unspecified joint, not elsewhere classified: Secondary | ICD-10-CM | POA: Diagnosis not present

## 2016-09-10 DIAGNOSIS — M6281 Muscle weakness (generalized): Secondary | ICD-10-CM | POA: Diagnosis not present

## 2016-09-16 DIAGNOSIS — M62830 Muscle spasm of back: Secondary | ICD-10-CM | POA: Diagnosis not present

## 2016-09-18 DIAGNOSIS — M546 Pain in thoracic spine: Secondary | ICD-10-CM | POA: Diagnosis not present

## 2016-09-18 DIAGNOSIS — M6281 Muscle weakness (generalized): Secondary | ICD-10-CM | POA: Diagnosis not present

## 2016-09-18 DIAGNOSIS — M545 Low back pain: Secondary | ICD-10-CM | POA: Diagnosis not present

## 2016-09-18 DIAGNOSIS — M2560 Stiffness of unspecified joint, not elsewhere classified: Secondary | ICD-10-CM | POA: Diagnosis not present

## 2016-09-28 DIAGNOSIS — M6281 Muscle weakness (generalized): Secondary | ICD-10-CM | POA: Diagnosis not present

## 2016-09-28 DIAGNOSIS — M546 Pain in thoracic spine: Secondary | ICD-10-CM | POA: Diagnosis not present

## 2016-09-28 DIAGNOSIS — M2560 Stiffness of unspecified joint, not elsewhere classified: Secondary | ICD-10-CM | POA: Diagnosis not present

## 2016-09-28 DIAGNOSIS — M545 Low back pain: Secondary | ICD-10-CM | POA: Diagnosis not present

## 2016-10-02 DIAGNOSIS — M545 Low back pain: Secondary | ICD-10-CM | POA: Diagnosis not present

## 2016-10-02 DIAGNOSIS — M546 Pain in thoracic spine: Secondary | ICD-10-CM | POA: Diagnosis not present

## 2016-10-02 DIAGNOSIS — M2560 Stiffness of unspecified joint, not elsewhere classified: Secondary | ICD-10-CM | POA: Diagnosis not present

## 2016-10-02 DIAGNOSIS — M6281 Muscle weakness (generalized): Secondary | ICD-10-CM | POA: Diagnosis not present

## 2016-10-06 DIAGNOSIS — M545 Low back pain: Secondary | ICD-10-CM | POA: Diagnosis not present

## 2016-10-06 DIAGNOSIS — M2560 Stiffness of unspecified joint, not elsewhere classified: Secondary | ICD-10-CM | POA: Diagnosis not present

## 2016-10-06 DIAGNOSIS — M6281 Muscle weakness (generalized): Secondary | ICD-10-CM | POA: Diagnosis not present

## 2016-10-06 DIAGNOSIS — M546 Pain in thoracic spine: Secondary | ICD-10-CM | POA: Diagnosis not present

## 2016-10-14 DIAGNOSIS — M2560 Stiffness of unspecified joint, not elsewhere classified: Secondary | ICD-10-CM | POA: Diagnosis not present

## 2016-10-14 DIAGNOSIS — M6281 Muscle weakness (generalized): Secondary | ICD-10-CM | POA: Diagnosis not present

## 2016-10-14 DIAGNOSIS — M546 Pain in thoracic spine: Secondary | ICD-10-CM | POA: Diagnosis not present

## 2016-10-14 DIAGNOSIS — M545 Low back pain: Secondary | ICD-10-CM | POA: Diagnosis not present

## 2016-10-21 DIAGNOSIS — M6281 Muscle weakness (generalized): Secondary | ICD-10-CM | POA: Diagnosis not present

## 2016-10-21 DIAGNOSIS — M545 Low back pain: Secondary | ICD-10-CM | POA: Diagnosis not present

## 2016-10-21 DIAGNOSIS — M2560 Stiffness of unspecified joint, not elsewhere classified: Secondary | ICD-10-CM | POA: Diagnosis not present

## 2016-10-21 DIAGNOSIS — M546 Pain in thoracic spine: Secondary | ICD-10-CM | POA: Diagnosis not present

## 2016-10-28 DIAGNOSIS — M2560 Stiffness of unspecified joint, not elsewhere classified: Secondary | ICD-10-CM | POA: Diagnosis not present

## 2016-10-28 DIAGNOSIS — M546 Pain in thoracic spine: Secondary | ICD-10-CM | POA: Diagnosis not present

## 2016-10-28 DIAGNOSIS — M6281 Muscle weakness (generalized): Secondary | ICD-10-CM | POA: Diagnosis not present

## 2016-10-28 DIAGNOSIS — M545 Low back pain: Secondary | ICD-10-CM | POA: Diagnosis not present

## 2016-11-05 DIAGNOSIS — M545 Low back pain: Secondary | ICD-10-CM | POA: Diagnosis not present

## 2016-11-05 DIAGNOSIS — M546 Pain in thoracic spine: Secondary | ICD-10-CM | POA: Diagnosis not present

## 2016-11-05 DIAGNOSIS — M2560 Stiffness of unspecified joint, not elsewhere classified: Secondary | ICD-10-CM | POA: Diagnosis not present

## 2016-11-05 DIAGNOSIS — M6281 Muscle weakness (generalized): Secondary | ICD-10-CM | POA: Diagnosis not present

## 2016-11-17 DIAGNOSIS — M6281 Muscle weakness (generalized): Secondary | ICD-10-CM | POA: Diagnosis not present

## 2016-11-17 DIAGNOSIS — M2560 Stiffness of unspecified joint, not elsewhere classified: Secondary | ICD-10-CM | POA: Diagnosis not present

## 2016-11-17 DIAGNOSIS — M545 Low back pain: Secondary | ICD-10-CM | POA: Diagnosis not present

## 2016-11-17 DIAGNOSIS — M546 Pain in thoracic spine: Secondary | ICD-10-CM | POA: Diagnosis not present

## 2016-11-26 DIAGNOSIS — M546 Pain in thoracic spine: Secondary | ICD-10-CM | POA: Diagnosis not present

## 2016-11-26 DIAGNOSIS — M545 Low back pain: Secondary | ICD-10-CM | POA: Diagnosis not present

## 2016-11-26 DIAGNOSIS — M6281 Muscle weakness (generalized): Secondary | ICD-10-CM | POA: Diagnosis not present

## 2016-11-26 DIAGNOSIS — M2560 Stiffness of unspecified joint, not elsewhere classified: Secondary | ICD-10-CM | POA: Diagnosis not present

## 2016-12-02 DIAGNOSIS — M545 Low back pain: Secondary | ICD-10-CM | POA: Diagnosis not present

## 2016-12-02 DIAGNOSIS — M6281 Muscle weakness (generalized): Secondary | ICD-10-CM | POA: Diagnosis not present

## 2016-12-02 DIAGNOSIS — M2560 Stiffness of unspecified joint, not elsewhere classified: Secondary | ICD-10-CM | POA: Diagnosis not present

## 2016-12-02 DIAGNOSIS — M546 Pain in thoracic spine: Secondary | ICD-10-CM | POA: Diagnosis not present

## 2016-12-09 DIAGNOSIS — M2560 Stiffness of unspecified joint, not elsewhere classified: Secondary | ICD-10-CM | POA: Diagnosis not present

## 2016-12-09 DIAGNOSIS — M545 Low back pain: Secondary | ICD-10-CM | POA: Diagnosis not present

## 2016-12-09 DIAGNOSIS — M6281 Muscle weakness (generalized): Secondary | ICD-10-CM | POA: Diagnosis not present

## 2016-12-09 DIAGNOSIS — M546 Pain in thoracic spine: Secondary | ICD-10-CM | POA: Diagnosis not present

## 2016-12-16 DIAGNOSIS — M545 Low back pain: Secondary | ICD-10-CM | POA: Diagnosis not present

## 2016-12-16 DIAGNOSIS — M546 Pain in thoracic spine: Secondary | ICD-10-CM | POA: Diagnosis not present

## 2016-12-16 DIAGNOSIS — M6281 Muscle weakness (generalized): Secondary | ICD-10-CM | POA: Diagnosis not present

## 2016-12-16 DIAGNOSIS — M2560 Stiffness of unspecified joint, not elsewhere classified: Secondary | ICD-10-CM | POA: Diagnosis not present

## 2016-12-25 DIAGNOSIS — M545 Low back pain: Secondary | ICD-10-CM | POA: Diagnosis not present

## 2016-12-25 DIAGNOSIS — M2560 Stiffness of unspecified joint, not elsewhere classified: Secondary | ICD-10-CM | POA: Diagnosis not present

## 2016-12-25 DIAGNOSIS — M546 Pain in thoracic spine: Secondary | ICD-10-CM | POA: Diagnosis not present

## 2016-12-25 DIAGNOSIS — M6281 Muscle weakness (generalized): Secondary | ICD-10-CM | POA: Diagnosis not present

## 2017-01-01 DIAGNOSIS — M546 Pain in thoracic spine: Secondary | ICD-10-CM | POA: Diagnosis not present

## 2017-01-01 DIAGNOSIS — M545 Low back pain: Secondary | ICD-10-CM | POA: Diagnosis not present

## 2017-01-01 DIAGNOSIS — M6281 Muscle weakness (generalized): Secondary | ICD-10-CM | POA: Diagnosis not present

## 2017-01-01 DIAGNOSIS — M2560 Stiffness of unspecified joint, not elsewhere classified: Secondary | ICD-10-CM | POA: Diagnosis not present

## 2017-01-13 DIAGNOSIS — M6281 Muscle weakness (generalized): Secondary | ICD-10-CM | POA: Diagnosis not present

## 2017-01-13 DIAGNOSIS — M545 Low back pain: Secondary | ICD-10-CM | POA: Diagnosis not present

## 2017-01-13 DIAGNOSIS — M2560 Stiffness of unspecified joint, not elsewhere classified: Secondary | ICD-10-CM | POA: Diagnosis not present

## 2017-01-13 DIAGNOSIS — M546 Pain in thoracic spine: Secondary | ICD-10-CM | POA: Diagnosis not present

## 2017-01-26 DIAGNOSIS — M545 Low back pain: Secondary | ICD-10-CM | POA: Diagnosis not present

## 2017-01-26 DIAGNOSIS — M2560 Stiffness of unspecified joint, not elsewhere classified: Secondary | ICD-10-CM | POA: Diagnosis not present

## 2017-01-26 DIAGNOSIS — M6281 Muscle weakness (generalized): Secondary | ICD-10-CM | POA: Diagnosis not present

## 2017-01-26 DIAGNOSIS — M546 Pain in thoracic spine: Secondary | ICD-10-CM | POA: Diagnosis not present

## 2017-02-16 DIAGNOSIS — M6281 Muscle weakness (generalized): Secondary | ICD-10-CM | POA: Diagnosis not present

## 2017-02-16 DIAGNOSIS — M546 Pain in thoracic spine: Secondary | ICD-10-CM | POA: Diagnosis not present

## 2017-02-16 DIAGNOSIS — M2560 Stiffness of unspecified joint, not elsewhere classified: Secondary | ICD-10-CM | POA: Diagnosis not present

## 2017-02-16 DIAGNOSIS — M545 Low back pain: Secondary | ICD-10-CM | POA: Diagnosis not present

## 2017-02-24 DIAGNOSIS — M2560 Stiffness of unspecified joint, not elsewhere classified: Secondary | ICD-10-CM | POA: Diagnosis not present

## 2017-02-24 DIAGNOSIS — M6281 Muscle weakness (generalized): Secondary | ICD-10-CM | POA: Diagnosis not present

## 2017-02-24 DIAGNOSIS — M545 Low back pain: Secondary | ICD-10-CM | POA: Diagnosis not present

## 2017-02-24 DIAGNOSIS — M546 Pain in thoracic spine: Secondary | ICD-10-CM | POA: Diagnosis not present

## 2017-03-01 DIAGNOSIS — K2900 Acute gastritis without bleeding: Secondary | ICD-10-CM | POA: Diagnosis not present

## 2017-03-01 DIAGNOSIS — F419 Anxiety disorder, unspecified: Secondary | ICD-10-CM | POA: Diagnosis not present

## 2017-03-01 DIAGNOSIS — G4700 Insomnia, unspecified: Secondary | ICD-10-CM | POA: Diagnosis not present

## 2017-03-23 DIAGNOSIS — M545 Low back pain: Secondary | ICD-10-CM | POA: Diagnosis not present

## 2017-03-23 DIAGNOSIS — M6281 Muscle weakness (generalized): Secondary | ICD-10-CM | POA: Diagnosis not present

## 2017-03-23 DIAGNOSIS — M2560 Stiffness of unspecified joint, not elsewhere classified: Secondary | ICD-10-CM | POA: Diagnosis not present

## 2017-03-23 DIAGNOSIS — M546 Pain in thoracic spine: Secondary | ICD-10-CM | POA: Diagnosis not present

## 2017-10-18 DIAGNOSIS — K2900 Acute gastritis without bleeding: Secondary | ICD-10-CM | POA: Diagnosis not present

## 2017-10-18 DIAGNOSIS — F419 Anxiety disorder, unspecified: Secondary | ICD-10-CM | POA: Diagnosis not present

## 2017-10-18 DIAGNOSIS — G4700 Insomnia, unspecified: Secondary | ICD-10-CM | POA: Diagnosis not present

## 2017-10-18 DIAGNOSIS — Z23 Encounter for immunization: Secondary | ICD-10-CM | POA: Diagnosis not present

## 2017-11-02 DIAGNOSIS — N3946 Mixed incontinence: Secondary | ICD-10-CM | POA: Diagnosis not present

## 2017-11-15 DIAGNOSIS — N3946 Mixed incontinence: Secondary | ICD-10-CM | POA: Diagnosis not present

## 2017-11-22 DIAGNOSIS — N393 Stress incontinence (female) (male): Secondary | ICD-10-CM | POA: Diagnosis not present

## 2017-11-26 DIAGNOSIS — K640 First degree hemorrhoids: Secondary | ICD-10-CM | POA: Diagnosis not present

## 2017-11-26 DIAGNOSIS — Z1211 Encounter for screening for malignant neoplasm of colon: Secondary | ICD-10-CM | POA: Diagnosis not present

## 2017-12-01 DIAGNOSIS — N393 Stress incontinence (female) (male): Secondary | ICD-10-CM | POA: Diagnosis not present

## 2018-01-04 DIAGNOSIS — N393 Stress incontinence (female) (male): Secondary | ICD-10-CM | POA: Diagnosis not present

## 2018-01-07 DIAGNOSIS — N393 Stress incontinence (female) (male): Secondary | ICD-10-CM | POA: Diagnosis not present

## 2018-01-20 DIAGNOSIS — N393 Stress incontinence (female) (male): Secondary | ICD-10-CM | POA: Diagnosis not present

## 2018-02-03 DIAGNOSIS — Z79899 Other long term (current) drug therapy: Secondary | ICD-10-CM | POA: Diagnosis not present

## 2018-02-03 DIAGNOSIS — I700 Atherosclerosis of aorta: Secondary | ICD-10-CM | POA: Diagnosis not present

## 2018-02-03 DIAGNOSIS — F419 Anxiety disorder, unspecified: Secondary | ICD-10-CM | POA: Diagnosis not present

## 2018-02-09 DIAGNOSIS — F419 Anxiety disorder, unspecified: Secondary | ICD-10-CM | POA: Diagnosis not present

## 2018-02-09 DIAGNOSIS — K2900 Acute gastritis without bleeding: Secondary | ICD-10-CM | POA: Diagnosis not present

## 2018-02-09 DIAGNOSIS — R739 Hyperglycemia, unspecified: Secondary | ICD-10-CM | POA: Diagnosis not present

## 2018-02-09 DIAGNOSIS — G4700 Insomnia, unspecified: Secondary | ICD-10-CM | POA: Diagnosis not present

## 2018-10-24 DIAGNOSIS — E7800 Pure hypercholesterolemia, unspecified: Secondary | ICD-10-CM | POA: Diagnosis not present

## 2018-10-24 DIAGNOSIS — G4700 Insomnia, unspecified: Secondary | ICD-10-CM | POA: Diagnosis not present

## 2018-10-26 ENCOUNTER — Other Ambulatory Visit: Payer: Self-pay | Admitting: Family Medicine

## 2018-10-26 DIAGNOSIS — I6523 Occlusion and stenosis of bilateral carotid arteries: Secondary | ICD-10-CM

## 2018-11-01 ENCOUNTER — Other Ambulatory Visit: Payer: Self-pay

## 2018-11-03 ENCOUNTER — Other Ambulatory Visit: Payer: Self-pay

## 2018-11-07 ENCOUNTER — Ambulatory Visit
Admission: RE | Admit: 2018-11-07 | Discharge: 2018-11-07 | Disposition: A | Discharge status: Home / Self Care | Payer: 59 | Source: Ambulatory Visit | Attending: Family Medicine | Admitting: Family Medicine

## 2018-11-07 DIAGNOSIS — I6523 Occlusion and stenosis of bilateral carotid arteries: Secondary | ICD-10-CM

## 2018-11-07 DIAGNOSIS — I6522 Occlusion and stenosis of left carotid artery: Secondary | ICD-10-CM | POA: Diagnosis not present

## 2019-01-04 DIAGNOSIS — M546 Pain in thoracic spine: Secondary | ICD-10-CM | POA: Diagnosis not present

## 2020-02-11 DIAGNOSIS — Z20822 Contact with and (suspected) exposure to covid-19: Secondary | ICD-10-CM | POA: Diagnosis not present

## 2020-02-11 DIAGNOSIS — Z03818 Encounter for observation for suspected exposure to other biological agents ruled out: Secondary | ICD-10-CM | POA: Diagnosis not present

## 2020-02-14 DIAGNOSIS — Z20822 Contact with and (suspected) exposure to covid-19: Secondary | ICD-10-CM | POA: Diagnosis not present

## 2020-02-14 DIAGNOSIS — Z03818 Encounter for observation for suspected exposure to other biological agents ruled out: Secondary | ICD-10-CM | POA: Diagnosis not present

## 2020-02-19 ENCOUNTER — Encounter (HOSPITAL_BASED_OUTPATIENT_CLINIC_OR_DEPARTMENT_OTHER): Payer: Self-pay

## 2020-02-19 ENCOUNTER — Emergency Department (HOSPITAL_COMMUNITY)
Admission: EM | Admit: 2020-02-19 | Discharge: 2020-02-19 | Disposition: A | Discharge status: Home / Self Care | Payer: BC Managed Care – PPO | Attending: Emergency Medicine | Admitting: Emergency Medicine

## 2020-02-19 ENCOUNTER — Other Ambulatory Visit: Payer: Self-pay

## 2020-02-19 ENCOUNTER — Emergency Department (HOSPITAL_BASED_OUTPATIENT_CLINIC_OR_DEPARTMENT_OTHER)
Admission: EM | Admit: 2020-02-19 | Discharge: 2020-02-19 | Disposition: A | Discharge status: Home / Self Care | Payer: BC Managed Care – PPO | Attending: Emergency Medicine | Admitting: Emergency Medicine

## 2020-02-19 DIAGNOSIS — Y999 Unspecified external cause status: Secondary | ICD-10-CM | POA: Insufficient documentation

## 2020-02-19 DIAGNOSIS — Y939 Activity, unspecified: Secondary | ICD-10-CM | POA: Diagnosis not present

## 2020-02-19 DIAGNOSIS — Y929 Unspecified place or not applicable: Secondary | ICD-10-CM | POA: Diagnosis not present

## 2020-02-19 DIAGNOSIS — S0181XA Laceration without foreign body of other part of head, initial encounter: Secondary | ICD-10-CM

## 2020-02-19 DIAGNOSIS — W01198A Fall on same level from slipping, tripping and stumbling with subsequent striking against other object, initial encounter: Secondary | ICD-10-CM | POA: Diagnosis not present

## 2020-02-19 DIAGNOSIS — W19XXXA Unspecified fall, initial encounter: Secondary | ICD-10-CM | POA: Insufficient documentation

## 2020-02-19 DIAGNOSIS — Z5321 Procedure and treatment not carried out due to patient leaving prior to being seen by health care provider: Secondary | ICD-10-CM | POA: Insufficient documentation

## 2020-02-19 DIAGNOSIS — S0990XA Unspecified injury of head, initial encounter: Secondary | ICD-10-CM | POA: Diagnosis not present

## 2020-02-19 MED ORDER — LIDOCAINE-EPINEPHRINE (PF) 2 %-1:200000 IJ SOLN
INTRAMUSCULAR | Status: AC
  Administered 2020-02-19: 10 mL
  Filled 2020-02-19: qty 10

## 2020-02-19 MED ORDER — IBUPROFEN 600 MG PO TABS: 600.0000 mg | ORAL_TABLET | Freq: Four times a day (QID) | ORAL | 0 refills | Status: AC | PRN

## 2020-02-19 MED ORDER — LIDOCAINE-EPINEPHRINE 2 %-1:100000 IJ SOLN: 20.0000 mL | Freq: Once | INTRAMUSCULAR | Status: DC

## 2020-02-19 MED ORDER — TETANUS-DIPHTH-ACELL PERTUSSIS 5-2.5-18.5 LF-MCG/0.5 IM SUSP
0.5000 mL | Freq: Once | INTRAMUSCULAR | Status: AC
  Administered 2020-02-19: 0.5 mL via INTRAMUSCULAR
  Filled 2020-02-19: qty 0.5

## 2020-02-19 NOTE — ED Triage Notes (Signed)
Pt presents with large deep cut to upper middle scalp, wet dressing applied in triage. Pt reports tripping over luggage and hitting her head on an end table, denies LOC, denies blood thinners.

## 2020-02-19 NOTE — ED Provider Notes (Signed)
[facility] EMERGENCY DEPARTMENT Provider Note   CSN: [id number] Arrival date & time: 02/19/20  1740     History Chief Complaint  Patient presents with  . Head Laceration    Melinda Graham is a 54 y.o. female.  The history is provided by the patient. No language interpreter was used.  Head Laceration     54 year old female presenting for evaluation of head injury.  Patient report earlier this morning, she accidentally tripped over a luggage, and fell forward and struck her forehead against the nightstand.  Suffered a laceration to the forehead.  She denies any loss of consciousness.  She did endorse sharp pain that is currently very minimal and rated as 1 out of 10.  Pain is nonradiating.  No confusion, no nausea, vomiting, neck pain, focal numbness or focal weakness.  Aside from a baby aspirin, she denies any anticoagulant.  She is not up-to-date with tetanus.  She did attempt to go to several different office to seek for care but was recommended to come here.  History reviewed. No pertinent past medical history.  There are no problems to display for this patient.   History reviewed. No pertinent surgical history.   OB History   No obstetric history on file.     No family history on file.  Social History   Tobacco Use  . Smoking status: Never Smoker  . Smokeless tobacco: Never Used  Substance Use Topics  . Alcohol use: Never  . Drug use: Never    Home Medications Prior to Admission medications   Not on File    Allergies    Macrobid [nitrofurantoin], Norfloxacin, and Sulfa antibiotics  Review of Systems   Review of Systems  All other systems reviewed and are negative.   Physical Exam Updated Vital Signs BP 118/86   Pulse 86   Temp 99.1 F (37.3 C) (Oral)   Resp 16   Ht 5\' 5"  (1.651 m)   Wt 72.6 kg   SpO2 91%   BMI 26.63 kg/m   Physical Exam Vitals and nursing note reviewed.  Constitutional:      General: She is not in acute  distress.    Appearance: She is well-developed.  HENT:     Head: Normocephalic.     Comments: Vertical 4 cm deep laceration noted to right forehead with minimal tenderness to palpation.  No foreign body noted.  No crepitus.  Not actively bleeding. Eyes:     Conjunctiva/sclera: Conjunctivae normal.  Musculoskeletal:     Cervical back: Normal range of motion and neck supple. No tenderness.  Skin:    Findings: No rash.  Neurological:     Mental Status: She is alert and oriented to person, place, and time.     GCS: GCS eye subscore is 4. GCS verbal subscore is 5. GCS motor subscore is 6.     Cranial Nerves: Cranial nerves are intact.     Sensory: Sensation is intact.     Motor: Motor function is intact.     Coordination: Coordination is intact.     Gait: Gait is intact.     Comments: Patient is alert and oriented x4.  Psychiatric:        Mood and Affect: Mood normal.     ED Results / Procedures / Treatments   Labs (all labs ordered are listed, but only abnormal results are displayed) Labs Reviewed - No data to display  EKG None  Radiology No results found.  Procedures . Laceration  Repair  Date/Time: 02/19/2020 7:42 PM Performed by: Domenic Moras, PA-C Authorized by: Domenic Moras, PA-C   Consent:    Consent obtained:  Verbal   Consent given by:  Patient   Risks discussed:  Infection, need for additional repair, pain, poor cosmetic result and poor wound healing   Alternatives discussed:  No treatment and delayed treatment Universal protocol:    Procedure explained and questions answered to patient or proxy's satisfaction: yes     Relevant documents present and verified: yes     Test results available and properly labeled: yes     Imaging studies available: yes     Required blood products, implants, devices, and special equipment available: yes     Site/side marked: yes     Immediately prior to procedure, a time out was called: yes     Patient identity confirmed:   Verbally with patient Anesthesia (see MAR for exact dosages):    Anesthesia method:  Local infiltration Laceration details:    Location:  Face   Face location:  Forehead   Length (cm):  4   Depth (mm):  10 Repair type:    Repair type:  Intermediate Pre-procedure details:    Preparation:  Patient was prepped and draped in usual sterile fashion Exploration:    Hemostasis achieved with:  Epinephrine   Wound exploration: wound explored through full range of motion and entire depth of wound probed and visualized     Wound extent: no underlying fracture noted and no vascular damage noted     Contaminated: no   Treatment:    Area cleansed with:  Betadine and saline   Amount of cleaning:  Standard   Irrigation solution:  Sterile saline   Irrigation method:  Pressure wash   Visualized foreign bodies/material removed: no   Skin repair:    Repair method:  Sutures   Suture size:  5-0   Suture material:  Prolene   Suture technique:  Simple interrupted   Number of sutures:  8 Approximation:    Approximation:  Close Post-procedure details:    Dressing:  Sterile dressing   Patient tolerance of procedure:  Tolerated well, no immediate complications Comments:     One deep suture with 4-0 vicryl, along with 8 simple interrupted prolene 5-0   (including critical care time)  Medications Ordered in ED Medications  lidocaine-EPINEPHrine (XYLOCAINE W/EPI) 2 %-1:100000 (with pres) injection 20 mL (has no administration in time range)  lidocaine-EPINEPHrine (XYLOCAINE W/EPI) 2 %-1:200000 (PF) injection (has no administration in time range)  Tdap (BOOSTRIX) injection 0.5 mL (0.5 mLs Intramuscular Given 02/19/20 1858)    ED Course  I have reviewed the triage vital signs and the nursing notes.  Pertinent labs & imaging results that were available during my care of the patient were reviewed by me and considered in my medical decision making (see chart for details).    MDM Rules/Calculators/A&P                           BP 118/86   Pulse 86   Temp 99.1 F (37.3 C) (Oral)   Resp 16   Ht 5\' 5"  (1.651 m)   Wt 72.6 kg   SpO2 91%   BMI 26.63 kg/m   Final Clinical Impression(s) / ED Diagnoses Final diagnoses:  None    Rx / DC Orders ED Discharge Orders    None     6:47 PM Patient had a mechanical fall  earlier this morning and struck her forehead.  She suffered a deep 4 cm laceration to her forehead.  She has no focal neuro deficit.  She is mentating appropriately.  Low suspicion for intracranial injury.  Will update tetanus, and will perform laceration repair.  7:45 PM Wound were thoroughly irrigated.  A deep suture was placed using 4.0 vicryl along with 8 simple interrupted sutures using 5.0 prolene.     Fayrene Helper, PA-C 02/19/20 1948    Benjiman Core, MD 02/20/20 [id number]

## 2020-02-19 NOTE — Discharge Instructions (Signed)
Please monitor your wound closely for any signs of infection.  Take ibuprofen as needed for pain.  Apply neosporin over the wound 2-3 times daily.  Have your sutures remove in 5 days.

## 2020-02-24 DIAGNOSIS — Z6825 Body mass index (BMI) 25.0-25.9, adult: Secondary | ICD-10-CM | POA: Diagnosis not present

## 2020-02-24 DIAGNOSIS — Z4802 Encounter for removal of sutures: Secondary | ICD-10-CM | POA: Diagnosis not present

## 2020-02-24 DIAGNOSIS — Z789 Other specified health status: Secondary | ICD-10-CM | POA: Diagnosis not present

## 2020-02-24 DIAGNOSIS — S0180XD Unspecified open wound of other part of head, subsequent encounter: Secondary | ICD-10-CM | POA: Diagnosis not present

## 2020-05-03 ENCOUNTER — Encounter (HOSPITAL_BASED_OUTPATIENT_CLINIC_OR_DEPARTMENT_OTHER): Payer: Self-pay

## 2020-05-03 ENCOUNTER — Emergency Department (HOSPITAL_BASED_OUTPATIENT_CLINIC_OR_DEPARTMENT_OTHER)
Admission: EM | Admit: 2020-05-03 | Discharge: 2020-05-03 | Disposition: A | Discharge status: Home / Self Care | Payer: BC Managed Care – PPO | Attending: Emergency Medicine | Admitting: Emergency Medicine

## 2020-05-03 ENCOUNTER — Emergency Department (HOSPITAL_BASED_OUTPATIENT_CLINIC_OR_DEPARTMENT_OTHER): Payer: BC Managed Care – PPO

## 2020-05-03 ENCOUNTER — Other Ambulatory Visit: Payer: Self-pay

## 2020-05-03 DIAGNOSIS — Y999 Unspecified external cause status: Secondary | ICD-10-CM | POA: Diagnosis not present

## 2020-05-03 DIAGNOSIS — S42201A Unspecified fracture of upper end of right humerus, initial encounter for closed fracture: Secondary | ICD-10-CM | POA: Diagnosis not present

## 2020-05-03 DIAGNOSIS — S8002XA Contusion of left knee, initial encounter: Secondary | ICD-10-CM | POA: Insufficient documentation

## 2020-05-03 DIAGNOSIS — S4991XA Unspecified injury of right shoulder and upper arm, initial encounter: Secondary | ICD-10-CM | POA: Diagnosis not present

## 2020-05-03 DIAGNOSIS — Y929 Unspecified place or not applicable: Secondary | ICD-10-CM | POA: Diagnosis not present

## 2020-05-03 DIAGNOSIS — Y939 Activity, unspecified: Secondary | ICD-10-CM | POA: Diagnosis not present

## 2020-05-03 DIAGNOSIS — S42291A Other displaced fracture of upper end of right humerus, initial encounter for closed fracture: Secondary | ICD-10-CM | POA: Diagnosis not present

## 2020-05-03 DIAGNOSIS — M25511 Pain in right shoulder: Secondary | ICD-10-CM | POA: Diagnosis not present

## 2020-05-03 DIAGNOSIS — S42251A Displaced fracture of greater tuberosity of right humerus, initial encounter for closed fracture: Secondary | ICD-10-CM | POA: Diagnosis not present

## 2020-05-03 DIAGNOSIS — W010XXA Fall on same level from slipping, tripping and stumbling without subsequent striking against object, initial encounter: Secondary | ICD-10-CM | POA: Diagnosis not present

## 2020-05-03 DIAGNOSIS — S42301A Unspecified fracture of shaft of humerus, right arm, initial encounter for closed fracture: Secondary | ICD-10-CM | POA: Diagnosis not present

## 2020-05-03 MED ORDER — ONDANSETRON 4 MG PO TBDP: 4.0000 mg | ORAL_TABLET | Freq: Three times a day (TID) | ORAL | 0 refills | Status: AC | PRN

## 2020-05-03 MED ORDER — HYDROCODONE-ACETAMINOPHEN 5-325 MG PO TABS: 1.0000 | ORAL_TABLET | ORAL | 0 refills | Status: AC | PRN

## 2020-05-03 NOTE — ED Triage Notes (Signed)
Pt arrives ambulatory to ED, reports tripping going into her garage last night bring in groceries, denies any medication PTA. Pt point to right upper arm near shoulder when speaking of pain.

## 2020-05-03 NOTE — ED Provider Notes (Signed)
[facility] EMERGENCY DEPARTMENT Provider Note   CSN: [id number] Arrival date & time: 05/03/20  1027     History Chief Complaint  Patient presents with  . Arm Injury    Melinda Graham is a 54 y.o. female.  54 year old female presents with complaint of right shoulder injury.  Patient states that she was carrying groceries into the house last night when she tripped over a box and fell injuring her right shoulder.  Patient was reports bruising to her left knee however is able to weight-bear without difficulty and mild soreness in her left hand.  Pain is worse with any movement of the right shoulder.  Pain has improved since placement in a shoulder immobilizer.  No other injuries, complaints, concerns.        History reviewed. No pertinent past medical history.  There are no problems to display for this patient.   History reviewed. No pertinent surgical history.   OB History   No obstetric history on file.     No family history on file.  Social History   Tobacco Use  . Smoking status: Never Smoker  . Smokeless tobacco: Never Used  Substance Use Topics  . Alcohol use: Never  . Drug use: Never    Home Medications Prior to Admission medications   Medication Sig Start Date End Date Taking? Authorizing Provider  HYDROcodone-acetaminophen (NORCO/VICODIN) 5-325 MG tablet Take 1 tablet by mouth every 4 (four) hours as needed. 05/03/20   Jeannie Fend, PA-C  ibuprofen (ADVIL) 600 MG tablet Take 1 tablet (600 mg total) by mouth every 6 (six) hours as needed. 02/19/20   Fayrene Helper, PA-C    Allergies    Macrobid [nitrofurantoin], Norfloxacin, and Sulfa antibiotics  Review of Systems   Review of Systems  Constitutional: Negative for fever.  Musculoskeletal: Positive for arthralgias. Negative for back pain, gait problem, neck pain and neck stiffness.  Skin: Negative for color change, rash and wound.  Neurological: Negative for weakness and numbness.    Hematological: Does not bruise/bleed easily.  Psychiatric/Behavioral: Negative for confusion.    Physical Exam Updated Vital Signs BP (!) 153/111 (BP Location: Left Arm)   Pulse 95   Temp 98.9 F (37.2 C) (Oral)   Resp 16   Ht 5\' 5"  (1.651 m)   Wt 72.6 kg   SpO2 96%   BMI 26.63 kg/m   Physical Exam Vitals and nursing note reviewed.  Constitutional:      General: She is not in acute distress.    Appearance: She is well-developed. She is not diaphoretic.  HENT:     Head: Normocephalic and atraumatic.  Cardiovascular:     Pulses: Normal pulses.  Pulmonary:     Effort: Pulmonary effort is normal.  Musculoskeletal:        General: Tenderness present. No deformity.     Comments: Mild swelling ecchymosis to left anterior knee.  Full range of motion without difficulty, no crepitus. Left wrist with normal range of motion, no bony tenderness. Right shoulder with pain with range of motion and palpation proximally.  Clavicle nontender.  Sensation intact, strong radial pulse present.  Skin:    General: Skin is warm and dry.     Capillary Refill: Capillary refill takes less than 2 seconds.     Findings: No erythema or rash.  Neurological:     Mental Status: She is alert and oriented to person, place, and time.     Sensory: No sensory deficit.  Motor: No weakness.  Psychiatric:        Behavior: Behavior normal.     ED Results / Procedures / Treatments   Labs (all labs ordered are listed, but only abnormal results are displayed) Labs Reviewed - No data to display  EKG None  Radiology DG Shoulder Right  Result Date: 05/03/2020 CLINICAL DATA:  Pt fell last night and is having right shoulder pain EXAM: RIGHT HUMERUS - 2+ VIEW; RIGHT SHOULDER - 2+ VIEW COMPARISON:  None. FINDINGS: There is a mildly displaced transverse fracture of the proximal humerus with involvement of the greater tuberosity. No evidence of dislocation. The remainder of the right humerus appears intact.  Regional soft tissues are unremarkable. IMPRESSION: Mildly displaced proximal right humerus fracture. Electronically Signed   By: Emmaline Kluver M.D.   On: 05/03/2020 11:57   DG Humerus Right  Result Date: 05/03/2020 CLINICAL DATA:  Pt fell last night and is having right shoulder pain EXAM: RIGHT HUMERUS - 2+ VIEW; RIGHT SHOULDER - 2+ VIEW COMPARISON:  None. FINDINGS: There is a mildly displaced transverse fracture of the proximal humerus with involvement of the greater tuberosity. No evidence of dislocation. The remainder of the right humerus appears intact. Regional soft tissues are unremarkable. IMPRESSION: Mildly displaced proximal right humerus fracture. Electronically Signed   By: Emmaline Kluver M.D.   On: 05/03/2020 11:57    Procedures Procedures (including critical care time)  Medications Ordered in ED Medications - No data to display  ED Course  I have reviewed the triage vital signs and the nursing notes.  Pertinent labs & imaging results that were available during my care of the patient were reviewed by me and considered in my medical decision making (see chart for details).  Clinical Course as of May 03 1402  Fri May 03, 2020  [id number] 54 year old female presents for right shoulder injury after fall last night.  On exam has tenderness in the right shoulder with normal sensation and strong radial pulse present.  X-ray shows proximal humerus fracture.  Patient was placed in a shoulder immobilizer, pain has improved, does not need pain medication at this time.  Will discharge with prescription for Norco with referral to orthopedics.   [LM]    Clinical Course User Index [LM] Alden Hipp   MDM Rules/Calculators/A&P                          Final Clinical Impression(s) / ED Diagnoses Final diagnoses:  Closed fracture of proximal end of right humerus, unspecified fracture morphology, initial encounter    Rx / DC Orders ED Discharge Orders         Ordered     HYDROcodone-acetaminophen (NORCO/VICODIN) 5-325 MG tablet  Every 4 hours PRN        05/03/20 1400           Jeannie Fend, PA-C 05/03/20 1403    Alvira Monday, MD 05/06/20 1027

## 2020-05-03 NOTE — Discharge Instructions (Signed)
Follow up with orthopedics, call to schedule an appointment. Apply ice to area for 20 minutes at a time. Take Motrin and Tylenol as needed as directed. Take Norco in place of Tylenol if needed for more severe pain. Wear shoulder immobilizer, may remove to bathe although limit movement.

## 2020-05-07 ENCOUNTER — Ambulatory Visit: Payer: BC Managed Care – PPO | Admitting: Family Medicine

## 2020-05-07 NOTE — Progress Notes (Deleted)
°  Melinda Graham - 54 y.o. female MRN [id number]  Date of birth: 04/26/1966  SUBJECTIVE:  Including CC & ROS.  No chief complaint on file.   Melinda Graham is a 54 y.o. female that is  ***.  ***   Review of Systems See HPI   HISTORY: Past Medical, Surgical, Social, and Family History Reviewed & Updated per EMR.   Pertinent Historical Findings include:  No past medical history on file.  No past surgical history on file.  No family history on file.  Social History   Socioeconomic History   Marital status: Married    Spouse name: Not on file   Number of children: Not on file   Years of education: Not on file   Highest education level: Not on file  Occupational History   Not on file  Tobacco Use   Smoking status: Never Smoker   Smokeless tobacco: Never Used  Substance and Sexual Activity   Alcohol use: Never   Drug use: Never   Sexual activity: Not on file  Other Topics Concern   Not on file  Social History Narrative   Not on file   Social Determinants of Health   Financial Resource Strain:    Difficulty of Paying Living Expenses: Not on file  Food Insecurity:    Worried About Running Out of Food in the Last Year: Not on file   Ran Out of Food in the Last Year: Not on file  Transportation Needs:    Lack of Transportation (Medical): Not on file   Lack of Transportation (Non-Medical): Not on file  Physical Activity:    Days of Exercise per Week: Not on file   Minutes of Exercise per Session: Not on file  Stress:    Feeling of Stress : Not on file  Social Connections:    Frequency of Communication with Friends and Family: Not on file   Frequency of Social Gatherings with Friends and Family: Not on file   Attends Religious Services: Not on file   Active Member of Clubs or Organizations: Not on file   Attends Banker Meetings: Not on file   Marital Status: Not on file  Intimate Partner Violence:    Fear of Current or  Ex-Partner: Not on file   Emotionally Abused: Not on file   Physically Abused: Not on file   Sexually Abused: Not on file     PHYSICAL EXAM:  VS: There were no vitals taken for this visit. Physical Exam Gen: NAD, alert, cooperative with exam, well-appearing MSK:  ***      ASSESSMENT & PLAN:   No problem-specific Assessment & Plan notes found for this encounter.

## 2020-05-08 ENCOUNTER — Encounter: Payer: Self-pay | Admitting: Family Medicine

## 2020-05-08 ENCOUNTER — Other Ambulatory Visit: Payer: Self-pay

## 2020-05-08 ENCOUNTER — Ambulatory Visit: Payer: BC Managed Care – PPO | Admitting: Family Medicine

## 2020-05-08 VITALS — BP 128/89 | HR 87 | Ht 65.0 in | Wt 160.0 lb

## 2020-05-08 DIAGNOSIS — S42201A Unspecified fracture of upper end of right humerus, initial encounter for closed fracture: Secondary | ICD-10-CM | POA: Insufficient documentation

## 2020-05-08 DIAGNOSIS — S42291A Other displaced fracture of upper end of right humerus, initial encounter for closed fracture: Secondary | ICD-10-CM

## 2020-05-08 NOTE — Patient Instructions (Signed)
Nice to meet you Please continue the sling  Please use ice as needed  Please try the duexis   Please send me a message in MyChart with any questions or updates.  Please see me back in 2 weeks.   --Dr. Jordan Likes

## 2020-05-08 NOTE — Assessment & Plan Note (Signed)
Injury occurred on 8/27.  Occurred after a fall. -Continue sling. -Counseled on supportive care. -Provided Duexis samples. -Follow-up in 2 weeks to reimage.

## 2020-05-08 NOTE — Progress Notes (Signed)
Melinda Graham - 54 y.o. female MRN [id number]  Date of birth: 06-01-1966  SUBJECTIVE:  Including CC & ROS.  Chief Complaint  Patient presents with  . Shoulder Injury    right x 05/02/2020    Melinda Graham is a 54 y.o. female that is presenting with right shoulder pain.  She had a fall on 8/27 and landed on her right shoulder.  She was found to have a fracture of the proximal humerus.  Has been placed in a sling and has had intermittent pain since that time.  No history of surgery or previous fracture..  Independent review of the right shoulder x-ray from 8/21 shows a mildly displaced greater tuberosity humeral fracture.   Review of Systems See HPI   HISTORY: Past Medical, Surgical, Social, and Family History Reviewed & Updated per EMR.   Pertinent Historical Findings include:  No past medical history on file.  No past surgical history on file.  No family history on file.  Social History   Socioeconomic History  . Marital status: Married    Spouse name: Not on file  . Number of children: Not on file  . Years of education: Not on file  . Highest education level: Not on file  Occupational History  . Not on file  Tobacco Use  . Smoking status: Never Smoker  . Smokeless tobacco: Never Used  Substance and Sexual Activity  . Alcohol use: Never  . Drug use: Never  . Sexual activity: Not on file  Other Topics Concern  . Not on file  Social History Narrative  . Not on file   Social Determinants of Health   Financial Resource Strain:   . Difficulty of Paying Living Expenses: Not on file  Food Insecurity:   . Worried About Programme researcher, broadcasting/film/video in the Last Year: Not on file  . Ran Out of Food in the Last Year: Not on file  Transportation Needs:   . Lack of Transportation (Medical): Not on file  . Lack of Transportation (Non-Medical): Not on file  Physical Activity:   . Days of Exercise per Week: Not on file  . Minutes of Exercise per Session: Not on file  Stress:   .  Feeling of Stress : Not on file  Social Connections:   . Frequency of Communication with Friends and Family: Not on file  . Frequency of Social Gatherings with Friends and Family: Not on file  . Attends Religious Services: Not on file  . Active Member of Clubs or Organizations: Not on file  . Attends Banker Meetings: Not on file  . Marital Status: Not on file  Intimate Partner Violence:   . Fear of Current or Ex-Partner: Not on file  . Emotionally Abused: Not on file  . Physically Abused: Not on file  . Sexually Abused: Not on file     PHYSICAL EXAM:  VS: BP 128/89   Pulse 87   Ht 5\' 5"  (1.651 m)   Wt 160 lb (72.6 kg)   BMI 26.63 kg/m  Physical Exam Gen: NAD, alert, cooperative with exam, well-appearing MSK:  Right shoulder: Tenderness to palpation over the proximal humerus. No swelling or ecchymosis. Normal finger abduction and flexion and extension. Neurovascularly intact     ASSESSMENT & PLAN:   Closed fracture of right proximal humerus Injury occurred on 8/27.  Occurred after a fall. -Continue sling. -Counseled on supportive care. -Provided Duexis samples. -Follow-up in 2 weeks to reimage.

## 2020-05-09 NOTE — Progress Notes (Signed)
Medication Samples have been provided to the patient.  Drug name: Duexis       Strength: 800mg /26.6mg         Qty: 2 boxes  LOT:  Exp.Date: 12/2020  Dosing instructions: take 1 tablet by mouth three (3) times a day.  The patient has been instructed regarding the correct time, dose, and frequency of taking this medication, including desired effects and most common side effects.   01/2021, MA 8:16 AM 05/09/2020

## 2020-05-22 ENCOUNTER — Other Ambulatory Visit: Payer: Self-pay

## 2020-05-22 ENCOUNTER — Encounter: Payer: Self-pay | Admitting: Family Medicine

## 2020-05-22 ENCOUNTER — Ambulatory Visit (HOSPITAL_BASED_OUTPATIENT_CLINIC_OR_DEPARTMENT_OTHER)
Admission: RE | Admit: 2020-05-22 | Discharge: 2020-05-22 | Disposition: A | Discharge status: Home / Self Care | Payer: BC Managed Care – PPO | Source: Ambulatory Visit | Attending: Family Medicine | Admitting: Family Medicine

## 2020-05-22 ENCOUNTER — Ambulatory Visit: Payer: BC Managed Care – PPO | Admitting: Family Medicine

## 2020-05-22 VITALS — BP 155/93 | HR 67 | Ht 67.0 in | Wt 155.0 lb

## 2020-05-22 DIAGNOSIS — S42291A Other displaced fracture of upper end of right humerus, initial encounter for closed fracture: Secondary | ICD-10-CM | POA: Diagnosis not present

## 2020-05-22 DIAGNOSIS — S42291D Other displaced fracture of upper end of right humerus, subsequent encounter for fracture with routine healing: Secondary | ICD-10-CM | POA: Diagnosis not present

## 2020-05-22 NOTE — Progress Notes (Signed)
  Melinda Graham - 54 y.o. female MRN [id number]  Date of birth: Jun 21, 1966  SUBJECTIVE:  Including CC & ROS.  Chief Complaint  Patient presents with  . Follow-up    right arm    Melinda Graham is a 54 y.o. female that is following up for her right shoulder fracture.  Pain is improved.  She has been wearing the sling.  Denies any numbness or tingling.   Review of Systems See HPI   HISTORY: Past Medical, Surgical, Social, and Family History Reviewed & Updated per EMR.   Pertinent Historical Findings include:  No past medical history on file.  No past surgical history on file.  No family history on file.  Social History   Socioeconomic History  . Marital status: Married    Spouse name: Not on file  . Number of children: Not on file  . Years of education: Not on file  . Highest education level: Not on file  Occupational History  . Not on file  Tobacco Use  . Smoking status: Never Smoker  . Smokeless tobacco: Never Used  Substance and Sexual Activity  . Alcohol use: Never  . Drug use: Never  . Sexual activity: Not on file  Other Topics Concern  . Not on file  Social History Narrative  . Not on file   Social Determinants of Health   Financial Resource Strain:   . Difficulty of Paying Living Expenses: Not on file  Food Insecurity:   . Worried About Programme researcher, broadcasting/film/video in the Last Year: Not on file  . Ran Out of Food in the Last Year: Not on file  Transportation Needs:   . Lack of Transportation (Medical): Not on file  . Lack of Transportation (Non-Medical): Not on file  Physical Activity:   . Days of Exercise per Week: Not on file  . Minutes of Exercise per Session: Not on file  Stress:   . Feeling of Stress : Not on file  Social Connections:   . Frequency of Communication with Friends and Family: Not on file  . Frequency of Social Gatherings with Friends and Family: Not on file  . Attends Religious Services: Not on file  . Active Member of Clubs or  Organizations: Not on file  . Attends Banker Meetings: Not on file  . Marital Status: Not on file  Intimate Partner Violence:   . Fear of Current or Ex-Partner: Not on file  . Emotionally Abused: Not on file  . Physically Abused: Not on file  . Sexually Abused: Not on file     PHYSICAL EXAM:  VS: BP (!) 155/93   Pulse 67   Ht 5\' 7"  (1.702 m)   Wt 155 lb (70.3 kg)   BMI 24.28 kg/m  Physical Exam Gen: NAD, alert, cooperative with exam, well-appearing MSK:  Right shoulder: Significant ecchymosis on medial aspect of the upper arm. Normal flexion extension of the elbow. Normal grip strength. Able to rotate with gravity. Neurovascular intact     ASSESSMENT & PLAN:   Closed fracture of right proximal humerus Injury occurred on 8/27.  Exam is reassuring today with range of motion movements. -Continue sling. -Counseled on home exercise therapy and supportive care. -X-ray. -Follow-up in 2 weeks.  Can try getting out of the sling at that point.

## 2020-05-22 NOTE — Patient Instructions (Signed)
Good to see you Please try the exercises  Please use ice   I will call with the results from today.  Please send me a message in MyChart with any questions or updates.  Please see me back in 2 weeks and we'll try to get you out of the sling.   --Dr. Jordan Likes

## 2020-05-23 NOTE — Assessment & Plan Note (Signed)
Injury occurred on 8/27.  Exam is reassuring today with range of motion movements. -Continue sling. -Counseled on home exercise therapy and supportive care. -X-ray. -Follow-up in 2 weeks.  Can try getting out of the sling at that point.

## 2020-05-24 ENCOUNTER — Telehealth: Payer: Self-pay | Admitting: Family Medicine

## 2020-05-24 NOTE — Telephone Encounter (Signed)
Left VM for patient. If she calls back please have her speak with a nurse/CMA and inform that her xray shows unchanged appearance which is normal and not showing healing yet. .   If any questions then please take the best time and phone number to call and I will try to call her back.   Myra Rude, MD [facility] 05/24/2020, 8:37 AM

## 2020-06-06 ENCOUNTER — Other Ambulatory Visit: Payer: Self-pay

## 2020-06-06 ENCOUNTER — Ambulatory Visit (INDEPENDENT_AMBULATORY_CARE_PROVIDER_SITE_OTHER): Payer: BC Managed Care – PPO | Admitting: Family Medicine

## 2020-06-06 ENCOUNTER — Ambulatory Visit (HOSPITAL_BASED_OUTPATIENT_CLINIC_OR_DEPARTMENT_OTHER)
Admission: RE | Admit: 2020-06-06 | Discharge: 2020-06-06 | Disposition: A | Discharge status: Home / Self Care | Payer: BC Managed Care – PPO | Source: Ambulatory Visit | Attending: Family Medicine | Admitting: Family Medicine

## 2020-06-06 DIAGNOSIS — S42291D Other displaced fracture of upper end of right humerus, subsequent encounter for fracture with routine healing: Secondary | ICD-10-CM

## 2020-06-06 DIAGNOSIS — M25511 Pain in right shoulder: Secondary | ICD-10-CM | POA: Diagnosis not present

## 2020-06-06 NOTE — Assessment & Plan Note (Addendum)
Injury occurred on 8/27.  Has been using the sling.  Range of motion and pain have both improved. - counseled on home exercise therapy and supportive care  - xray  -Referral to physical therapy. -Follow-up in 4 weeks.

## 2020-06-06 NOTE — Patient Instructions (Signed)
Good to see you Please use heat  Please try the stretches  Physical therapy will give you a call   Please send me a message in MyChart with any questions or updates.  Please see me back in 4 weeks.   --Dr. Jordan Likes

## 2020-06-06 NOTE — Progress Notes (Signed)
  Melinda DESCOTEAUX - 54 y.o. female MRN [id number]  Date of birth: Dec 23, 1965  SUBJECTIVE:  Including CC & ROS.  No chief complaint on file.   Melinda Graham is a 54 y.o. female that is following up for her right proximal humerus fracture.  She has improvement of her pain and function.  Swelling and ecchymosis have improved.  She has been doing home exercises.   Review of Systems See HPI   HISTORY: Past Medical, Surgical, Social, and Family History Reviewed & Updated per EMR.   Pertinent Historical Findings include:  No past medical history on file.  No past surgical history on file.  No family history on file.  Social History   Socioeconomic History  . Marital status: Married    Spouse name: Not on file  . Number of children: Not on file  . Years of education: Not on file  . Highest education level: Not on file  Occupational History  . Not on file  Tobacco Use  . Smoking status: Never Smoker  . Smokeless tobacco: Never Used  Substance and Sexual Activity  . Alcohol use: Never  . Drug use: Never  . Sexual activity: Not on file  Other Topics Concern  . Not on file  Social History Narrative  . Not on file   Social Determinants of Health   Financial Resource Strain:   . Difficulty of Paying Living Expenses: Not on file  Food Insecurity:   . Worried About Programme researcher, broadcasting/film/video in the Last Year: Not on file  . Ran Out of Food in the Last Year: Not on file  Transportation Needs:   . Lack of Transportation (Medical): Not on file  . Lack of Transportation (Non-Medical): Not on file  Physical Activity:   . Days of Exercise per Week: Not on file  . Minutes of Exercise per Session: Not on file  Stress:   . Feeling of Stress : Not on file  Social Connections:   . Frequency of Communication with Friends and Family: Not on file  . Frequency of Social Gatherings with Friends and Family: Not on file  . Attends Religious Services: Not on file  . Active Member of Clubs or  Organizations: Not on file  . Attends Banker Meetings: Not on file  . Marital Status: Not on file  Intimate Partner Violence:   . Fear of Current or Ex-Partner: Not on file  . Emotionally Abused: Not on file  . Physically Abused: Not on file  . Sexually Abused: Not on file     PHYSICAL EXAM:  VS: There were no vitals taken for this visit. Physical Exam Gen: NAD, alert, cooperative with exam, well-appearing MSK:  Right shoulder: Limited external rotation and abduction. Normal elbow flexion. Normal grip strength. Neurovascular intact     ASSESSMENT & PLAN:   Closed fracture of right proximal humerus Injury occurred on 8/27.  Has been using the sling.  Range of motion and pain have both improved. - counseled on home exercise therapy and supportive care  - xray  -Referral to physical therapy. -Follow-up in 4 weeks.

## 2020-06-10 ENCOUNTER — Telehealth: Payer: Self-pay | Admitting: Family Medicine

## 2020-06-10 ENCOUNTER — Ambulatory Visit: Payer: BC Managed Care – PPO | Admitting: Family Medicine

## 2020-06-10 NOTE — Telephone Encounter (Signed)
Informed of results.   Myra Rude, MD [facility] 06/10/2020, 3:45 PM

## 2020-06-19 ENCOUNTER — Other Ambulatory Visit: Payer: Self-pay

## 2020-06-19 ENCOUNTER — Ambulatory Visit: Payer: BC Managed Care – PPO | Attending: Family Medicine | Admitting: Physical Therapy

## 2020-06-19 ENCOUNTER — Encounter: Payer: Self-pay | Admitting: Physical Therapy

## 2020-06-19 DIAGNOSIS — M6281 Muscle weakness (generalized): Secondary | ICD-10-CM | POA: Diagnosis not present

## 2020-06-19 DIAGNOSIS — M25611 Stiffness of right shoulder, not elsewhere classified: Secondary | ICD-10-CM | POA: Diagnosis not present

## 2020-06-19 DIAGNOSIS — R2681 Unsteadiness on feet: Secondary | ICD-10-CM | POA: Insufficient documentation

## 2020-06-19 DIAGNOSIS — M25511 Pain in right shoulder: Secondary | ICD-10-CM | POA: Diagnosis not present

## 2020-06-19 NOTE — Therapy (Signed)
[facility] [address]  [address] [place], [place], [address] Phone: [phone]   Fax:  [phone]  Physical Therapy Evaluation  Patient Details  Name: DONYELL Graham MRN: [id number] Date of Birth: 06-14-1966 Referring Provider (PT): Clare Gandy, MD   Encounter Date: 06/19/2020   PT End of Session - 06/19/20 1727    Visit Number 1    Number of Visits 13    Date for PT Re-Evaluation 07/31/20    Authorization Type [employer]    Authorization - Visit Number 1    Authorization - Number of Visits 30    PT Start Time 1456   pt late   PT Stop Time 1532    PT Time Calculation (min) 36 min    Activity Tolerance Patient tolerated treatment well    Behavior During Therapy [facility] for tasks assessed/performed           History reviewed. No pertinent past medical history.  History reviewed. No pertinent surgical history.  There were no vitals filed for this visit.    Subjective Assessment - 06/19/20 1458    Subjective Patient reports that on 05/02/20 she slipped on a pile of books, falling and hurting her R arm. A fracture was found- thus patient was put into a sling up until a week and a half ago. Still wears it once in a while to protect her arm. Pain occurs over the R anterior arm, intermittently radiating to the wrist. Denies N/T. Was advised not to lift her arm actively on her own- when she does this to the side it hurts. Better with heat. Feels like her balance has been off for a while, and is interested in improving it.    Pertinent History none    Limitations Lifting;House hold activities    Diagnostic tests 06/06/20 R shoulder xray: Comminuted fracture of the surgical neck of the right proximal humerus extending into the greater tuberosity with fragmentation of the greater tuberosity without significant displacement    Patient Stated Goals decrease pain    Currently in Pain? Yes    Pain Score 2     Pain Location Shoulder     Pain Orientation Right    Pain Descriptors / Indicators Dull    Pain Type Acute pain              [facility] PT Assessment - 06/19/20 1502      Assessment   Medical Diagnosis Proximal end of R humerus fx    Referring Provider (PT) Clare Gandy, MD    Onset Date/Surgical Date 05/02/20    Hand Dominance Right    Next MD Visit not scheduled    Prior Therapy yes- for back      Precautions   Precautions --    Precaution Comments per MD- no AROM      Balance Screen   Has the patient fallen in the past 6 months Yes    How many times? 1    Has the patient had a decrease in activity level because of a fear of falling?  Yes    Is the patient reluctant to leave their home because of a fear of falling?  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    Leisure hiking      Cognition   Overall Cognitive Status Within Functional Limits for tasks assessed      Sensation   Light Touch Appears Intact      Posture/Postural Control   Posture/Postural Control Postural limitations    Postural Limitations Rounded Shoulders;Increased thoracic kyphosis      ROM / Strength   AROM / PROM / Strength AROM;PROM;Strength      AROM   AROM Assessment Site Shoulder    Right/Left Shoulder Left    Left Shoulder Flexion 137 Degrees    Left Shoulder ABduction 156 Degrees    Left Shoulder Internal Rotation --   FIR T6   Left Shoulder External Rotation --   FER T2     PROM   PROM Assessment Site Shoulder    Right/Left Shoulder Right    Right Shoulder Flexion 130 Degrees   pain   Right Shoulder ABduction 85 Degrees   scaption; pain   Right Shoulder Internal Rotation 65 Degrees    Right Shoulder External Rotation 46 Degrees   pain      Strength   Strength Assessment Site Shoulder    Right/Left Shoulder Left    Left Shoulder Flexion 4+/5    Left Shoulder ABduction 4+/5    Left Shoulder Internal Rotation 4/5    Left Shoulder External Rotation 4/5      Palpation   Palpation comment no TTP over R shoulder; tight R UT      Ambulation/Gait   Assistive device None    Gait Pattern Step-through pattern;Step-to pattern;Decreased step length - right;Decreased step length - left;Decreased trunk rotation   guarded gait with short step length   Ambulation Surface Level;Indoor    Gait velocity decreased                      Objective measurements completed on examination: See above findings.               PT Education - 06/19/20 1600    Education Details prognosis, POC, HEP- advised patient not to push into pain    Person(s) Educated Patient    Methods Explanation;Demonstration;Tactile cues;Verbal cues;Handout    Comprehension Verbalized understanding;Returned demonstration            PT Short Term Goals - 06/19/20 1734      PT SHORT TERM GOAL #1   Title Patient to be independent with initial HEP.    Time 2    Period Weeks    Status New    Target Date 07/03/20             PT Long Term Goals - 06/19/20 1735      PT LONG TERM GOAL #1   Title Patient to be independent with advanced HEP.    Time 6    Period Weeks    Status New    Target Date 07/31/20      PT LONG TERM GOAL #2   Title Patient to demonstrate R shoulder AROM/PROM [facility] and without pain limiting.    Time 6    Period Weeks    Status New    Target Date 07/31/20      PT LONG TERM GOAL #3   Title Patient to demonstrate R shoulder strength >/=4+/5.    Time 6    Period Weeks    Status  New    Target Date 07/31/20      PT LONG TERM GOAL #4   Title Patient to lift 5lbs to overhead shelf with R UE with 0/10 pain and good scapular mechanics.    Time 6    Period Weeks    Status New    Target Date 07/31/20      PT LONG  TERM GOAL #5   Title Patient to score >19/24 on DGI to decrease risk of falls.    Time 6    Period Weeks    Status New    Target Date 07/31/20                  Plan - 06/19/20 1730    Clinical Impression Statement Patient is a 54y/o F presenting to OPPT with c/o R shoulder pain after sustaining a R humeral fx after a fall on 05/02/20. Patient now out of sling for the most part. Pain occurs over the anterior shoulder- worse when trying to perform active movement. Notes that MD has advised her to hold off on AROM for the time being. Also notes recently worsening balance, and interested in addressing this. Patient today presenting with rounded and kyphotic posture, R shoulder weakness, limited and painful R shoulder PROM, tightness in R UT, and gait deviations. Patient was educated on gentle PROM/AAROM HEP and advised not to push into pain. Patient reported understanding. Would benefit from skilled PT services 2x/week for 6 weeks to address aforementioned impairments. Plan to assess balance at next session.    Personal Factors and Comorbidities Age;Sex;Behavior Pattern;Time since onset of injury/illness/exacerbation;Past/Current Experience;Profession    Examination-Activity Limitations Bathing;Sleep;Carry;Dressing;Hygiene/Grooming;Lift;Reach Overhead;Self Feeding    Examination-Participation Restrictions Cleaning;Community Activity;Shop;Driving;Yard Work;Laundry;Meal Prep;Occupation    Stability/Clinical Decision Making Stable/Uncomplicated    Clinical Decision Making Low    Rehab Potential Good    PT Frequency 2x / week    PT Duration 6 weeks    PT Treatment/Interventions ADLs/Self Care Home Management;Canalith Repostioning;Cryotherapy;Electrical Stimulation;Moist Heat;Balance training;Therapeutic exercise;Therapeutic activities;Functional mobility training;Stair training;Gait training;Ultrasound;Neuromuscular re-education;Patient/family education;Manual techniques;Vasopneumatic  Device;Vestibular;Taping;Energy conservation;Dry needling;Passive range of motion    PT Next Visit Plan DGI; shoulder FOTO; reassess HEP; progress PROM/AROM    Consulted and Agree with Plan of Care Patient           Patient will benefit from skilled therapeutic intervention in order to improve the following deficits and impairments:  Hypomobility, Increased edema, Decreased activity tolerance, Decreased strength, Increased fascial restricitons, Impaired UE functional use, Pain, Decreased balance, Difficulty walking, Increased muscle spasms, Improper body mechanics, Dizziness, Postural dysfunction, Impaired flexibility  Visit Diagnosis: Acute pain of right shoulder  Stiffness of right shoulder, not elsewhere classified  Muscle weakness (generalized)  Unsteadiness on feet     Problem List Patient Active Problem List   Diagnosis Date Noted  . Closed fracture of right proximal humerus 05/08/2020     Anette Guarneri, PT, DPT 06/19/20 5:39 PM   [facility] [address]  [address] [place], [place], [address] Phone: [phone]   Fax:  [phone]  Name: BRELEIGH CARPINO MRN: [id number] Date of Birth: October 10, 1965

## 2020-06-26 ENCOUNTER — Other Ambulatory Visit: Payer: Self-pay

## 2020-06-26 ENCOUNTER — Ambulatory Visit: Payer: BC Managed Care – PPO | Admitting: Physical Therapy

## 2020-06-26 ENCOUNTER — Encounter: Payer: Self-pay | Admitting: Physical Therapy

## 2020-06-26 DIAGNOSIS — M25511 Pain in right shoulder: Secondary | ICD-10-CM | POA: Diagnosis not present

## 2020-06-26 DIAGNOSIS — M6281 Muscle weakness (generalized): Secondary | ICD-10-CM

## 2020-06-26 DIAGNOSIS — R2681 Unsteadiness on feet: Secondary | ICD-10-CM | POA: Diagnosis not present

## 2020-06-26 DIAGNOSIS — M25611 Stiffness of right shoulder, not elsewhere classified: Secondary | ICD-10-CM

## 2020-06-26 NOTE — Therapy (Signed)
[facility] Outpatient Rehabilitation [facility] [address]  [address] [place], [place], [address] Phone: [phone]   Fax:  [phone]  Physical Therapy Treatment  Patient Details  Name: Melinda Graham MRN: [id number] Date of Birth: March 04, 1966 Referring Provider (PT): Clare Gandy, MD   Encounter Date: 06/26/2020   PT End of Session - 06/26/20 1115    Visit Number 2    Number of Visits 13    Date for PT Re-Evaluation 07/31/20    Authorization Type BCBS    Authorization - Visit Number 2    Authorization - Number of Visits 30    PT Start Time 0932    PT Stop Time 1013    PT Time Calculation (min) 41 min    Activity Tolerance Patient tolerated treatment well;Patient limited by pain    Behavior During Therapy [facility] for tasks assessed/performed           History reviewed. No pertinent past medical history.  History reviewed. No pertinent surgical history.  There were no vitals filed for this visit.   Subjective Assessment - 06/26/20 0932    Subjective Patient reporting "it is going good." Has been performing HEP and denies questions/issues.    Pertinent History none    Diagnostic tests 06/06/20 R shoulder xray: Comminuted fracture of the surgical neck of the right proximal humerus extending into the greater tuberosity with fragmentation of the greater tuberosity without significant displacement    Patient Stated Goals decrease pain    Currently in Pain? No/denies              [facility] PT Assessment - 06/26/20 0001      Observation/Other Assessments   Focus on Therapeutic Outcomes (FOTO)  Upper arm: 40 (60% limited, 37% predicted)      Balance   Balance Assessed Yes      Static Standing Balance   Static Standing - Comment/# of Minutes M-CTSIB: mild sway in first 3 conditions, moderate sway with EC/foam      Standardized Balance Assessment   Standardized Balance Assessment Dynamic Gait Index      Dynamic Gait Index   Level Surface Normal     Change in Gait Speed Normal    Gait with Horizontal Head Turns Mild Impairment    Gait with Vertical Head Turns Normal    Gait and Pivot Turn Normal    Step Over Obstacle Normal    Step Around Obstacles Normal    Steps Normal    Total Score 23                         [facility] Adult PT Treatment/Exercise - 06/26/20 0001      Exercises   Exercises Shoulder      Shoulder Exercises: Supine   Flexion PROM;Right;10 reps    Flexion Limitations opposite arm lifting at wrist to tolerance      Shoulder Exercises: Seated   External Rotation AAROM;Right;10 reps    External Rotation Limitations with wand to tolerance with shoulder in neutral    Internal Rotation AAROM;Right;10 reps    Internal Rotation Limitations with wand to tolerance with shoulder in neutral      Shoulder Exercises: Standing   Other Standing Exercises R shoulder pendulums 30" each ant/pos, M/L, CW, CCW   much improved     Manual Therapy   Manual Therapy Passive ROM    Manual therapy comments supine with R arm elevated on pillow  Passive ROM Gentle R shoulder PROM into flexion, scaption, abduction, IR/ER to tolerance                  PT Education - 06/26/20 1114    Education Details edu on balance testing results and clinical relevance    Person(s) Educated Patient    Methods Explanation;Demonstration    Comprehension Verbalized understanding            PT Short Term Goals - 06/26/20 1116      PT SHORT TERM GOAL #1   Title Patient to be independent with initial HEP.    Time 2    Period Weeks    Status On-going    Target Date 07/03/20             PT Long Term Goals - 06/26/20 1116      PT LONG TERM GOAL #1   Title Patient to be independent with advanced HEP.    Time 6    Period Weeks    Status On-going      PT LONG TERM GOAL #2   Title Patient to demonstrate R shoulder AROM/PROM WFL and without pain limiting.    Time 6    Period Weeks    Status On-going      PT LONG  TERM GOAL #3   Title Patient to demonstrate R shoulder strength >/=4+/5.    Time 6    Period Weeks    Status On-going      PT LONG TERM GOAL #4   Title Patient to lift 5lbs to overhead shelf with R UE with 0/10 pain and good scapular mechanics.    Time 6    Period Weeks    Status On-going      PT LONG TERM GOAL #5   Title Patient to score >19/24 on DGI to decrease risk of falls.    Time 6    Period Weeks    Status On-going                 Plan - 06/26/20 1115    Clinical Impression Statement Patient reporting "it is going good." Has been performing HEP and denies questions/issues. Much improved form and guarding with pendulums today. R shoulder PROM was performed in all planes to tolerance with good ROM- mild pain reported at end-range ER. Review of self-PROM shoulder flexion required as patient reported that she forgot about this exercise, but good carryover of AAROM IR/ER demonstrated. Assessed balance with patient scoring well on DGI- small challenge seen on walking with horizontal head turns. More difficulty demonstrated with static balance without visual or proprioceptive input, demonstrating need to address this as patient would like to return to walking on trails. Discussed balance testing results with patient, who reported understanding and without complaints at end of session.    PT Treatment/Interventions ADLs/Self Care Home Management;Canalith Repostioning;Cryotherapy;Electrical Stimulation;Moist Heat;Balance training;Therapeutic exercise;Therapeutic activities;Functional mobility training;Stair training;Gait training;Ultrasound;Neuromuscular re-education;Patient/family education;Manual techniques;Vasopneumatic Device;Vestibular;Taping;Energy conservation;Dry needling;Passive range of motion    PT Next Visit Plan progress shoulder PROM/AROM; distal joint strengthening    Consulted and Agree with Plan of Care Patient           Patient will benefit from skilled  therapeutic intervention in order to improve the following deficits and impairments:  Hypomobility, Increased edema, Decreased activity tolerance, Decreased strength, Increased fascial restricitons, Impaired UE functional use, Pain, Decreased balance, Difficulty walking, Increased muscle spasms, Improper body mechanics, Dizziness, Postural dysfunction, Impaired flexibility  Visit Diagnosis: Acute pain  of right shoulder  Stiffness of right shoulder, not elsewhere classified  Muscle weakness (generalized)  Unsteadiness on feet     Problem List Patient Active Problem List   Diagnosis Date Noted  . Closed fracture of right proximal humerus 05/08/2020     Anette Guarneri, PT, DPT 06/26/20 11:18 AM   [facility] Outpatient Rehabilitation [facility] [address]  [address] [place], [place], [address] Phone: [phone]   Fax:  [phone]  Name: Melinda Graham MRN: [id number] Date of Birth: Sep 08, 1965

## 2020-06-28 ENCOUNTER — Other Ambulatory Visit: Payer: Self-pay

## 2020-06-28 ENCOUNTER — Ambulatory Visit: Payer: BC Managed Care – PPO

## 2020-06-28 DIAGNOSIS — M25511 Pain in right shoulder: Secondary | ICD-10-CM | POA: Diagnosis not present

## 2020-06-28 DIAGNOSIS — M6281 Muscle weakness (generalized): Secondary | ICD-10-CM

## 2020-06-28 DIAGNOSIS — R2681 Unsteadiness on feet: Secondary | ICD-10-CM

## 2020-06-28 DIAGNOSIS — M25611 Stiffness of right shoulder, not elsewhere classified: Secondary | ICD-10-CM

## 2020-06-28 NOTE — Therapy (Signed)
[facility] Outpatient Rehabilitation [facility] [address]  [address] [place], [place], [address] Phone: [phone]   Fax:  [phone]  Physical Therapy Treatment  Patient Details  Name: Melinda Graham MRN: [id number] Date of Birth: 23-Feb-1966 Referring Provider (PT): Clare Gandy, MD   Encounter Date: 06/28/2020   PT End of Session - 06/28/20 0937    Visit Number 3    Number of Visits 13    Date for PT Re-Evaluation 07/31/20    Authorization Type BCBS    Authorization - Visit Number 3    Authorization - Number of Visits 30    PT Start Time [phone]    PT Stop Time 1015    PT Time Calculation (min) 44 min    Activity Tolerance Patient tolerated treatment well;Patient limited by pain    Behavior During Therapy [facility] for tasks assessed/performed           History reviewed. No pertinent past medical history.  History reviewed. No pertinent surgical history.  There were no vitals filed for this visit.   Subjective Assessment - 06/28/20 0935    Subjective Pt. doing ok.    Pertinent History none    Diagnostic tests 06/06/20 R shoulder xray: Comminuted fracture of the surgical neck of the right proximal humerus extending into the greater tuberosity with fragmentation of the greater tuberosity without significant displacement    Patient Stated Goals decrease pain    Currently in Pain? No/denies    Pain Score 0-No pain    Pain Location Shoulder    Pain Orientation Right    Multiple Pain Sites No                             [facility] Adult PT Treatment/Exercise - 06/28/20 0001      Elbow Exercises   Elbow Flexion Right;AROM;15 reps      Shoulder Exercises: Supine   External Rotation Right;10 reps;AAROM    External Rotation Limitations wand pillow under elbow    Flexion AAROM;Right;10 reps    Flexion Limitations cane     ABduction AAROM;Right;10 reps    ABduction Limitations cane       Shoulder Exercises: Seated   Retraction Both;10  reps;Strengthening    Retraction Limitations 5" hold       Shoulder Exercises: Standing   Other Standing Exercises R shoulder pendulums 30" each ant/pos, M/L, CW, CCW      Shoulder Exercises: Pulleys   Flexion 3 minutes    Flexion Limitations cues to avoid painful arc    Scaption 3 minutes    Scaption Limitations cues to avoid pianful arc                  PT Education - 06/28/20 1215    Education Details HEP update    Person(s) Educated Patient    Methods Explanation;Demonstration;Verbal cues;Handout    Comprehension Verbalized understanding;Returned demonstration;Verbal cues required            PT Short Term Goals - 06/26/20 1116      PT SHORT TERM GOAL #1   Title Patient to be independent with initial HEP.    Time 2    Period Weeks    Status On-going    Target Date 07/03/20             PT Long Term Goals - 06/26/20 1116      PT LONG TERM GOAL #1  Title Patient to be independent with advanced HEP.    Time 6    Period Weeks    Status On-going      PT LONG TERM GOAL #2   Title Patient to demonstrate R shoulder AROM/PROM WFL and without pain limiting.    Time 6    Period Weeks    Status On-going      PT LONG TERM GOAL #3   Title Patient to demonstrate R shoulder strength >/=4+/5.    Time 6    Period Weeks    Status On-going      PT LONG TERM GOAL #4   Title Patient to lift 5lbs to overhead shelf with R UE with 0/10 pain and good scapular mechanics.    Time 6    Period Weeks    Status On-going      PT LONG TERM GOAL #5   Title Patient to score >19/24 on DGI to decrease risk of falls.    Time 6    Period Weeks    Status On-going                 Plan - 06/28/20 0944    Clinical Impression Statement Melinda Graham doing well and denies recent increased pain at R shoulder.  Progressed AAROM and this was well tolerated thus updated HEP accordingly.  Melinda Graham able to demo/verbalize good understanding of avoiding painful end range of motion with  AAROM cane exercise in supine  today and instructed to inform therapist at upcoming visit of tolerance for updated HEP (handout issued to pt.).    Rehab Potential Good    PT Frequency 2x / week    PT Duration 6 weeks    PT Treatment/Interventions ADLs/Self Care Home Management;Canalith Repostioning;Cryotherapy;Electrical Stimulation;Moist Heat;Balance training;Therapeutic exercise;Therapeutic activities;Functional mobility training;Stair training;Gait training;Ultrasound;Neuromuscular re-education;Patient/family education;Manual techniques;Vasopneumatic Device;Vestibular;Taping;Energy conservation;Dry needling;Passive range of motion    PT Next Visit Plan progress shoulder PROM/AROM; distal joint strengthening    Consulted and Agree with Plan of Care Patient           Patient will benefit from skilled therapeutic intervention in order to improve the following deficits and impairments:  Hypomobility, Increased edema, Decreased activity tolerance, Decreased strength, Increased fascial restricitons, Impaired UE functional use, Pain, Decreased balance, Difficulty walking, Increased muscle spasms, Improper body mechanics, Dizziness, Postural dysfunction, Impaired flexibility  Visit Diagnosis: Acute pain of right shoulder  Stiffness of right shoulder, not elsewhere classified  Muscle weakness (generalized)  Unsteadiness on feet     Problem List Patient Active Problem List   Diagnosis Date Noted  . Closed fracture of right proximal humerus 05/08/2020    Kermit Balo, PTA 06/28/20 12:19 PM   [facility] Outpatient Rehabilitation [facility] [address]  [address] [place], [place], [address] Phone: [phone]   Fax:  [phone]  Name: Melinda Graham MRN: [id number] Date of Birth: 04/29/1966

## 2020-07-02 ENCOUNTER — Other Ambulatory Visit: Payer: Self-pay

## 2020-07-02 ENCOUNTER — Other Ambulatory Visit (HOSPITAL_BASED_OUTPATIENT_CLINIC_OR_DEPARTMENT_OTHER): Payer: Self-pay | Admitting: Family Medicine

## 2020-07-02 ENCOUNTER — Encounter: Payer: Self-pay | Admitting: Physical Therapy

## 2020-07-02 ENCOUNTER — Ambulatory Visit: Payer: BC Managed Care – PPO | Admitting: Physical Therapy

## 2020-07-02 DIAGNOSIS — Z1231 Encounter for screening mammogram for malignant neoplasm of breast: Secondary | ICD-10-CM

## 2020-07-02 DIAGNOSIS — M25611 Stiffness of right shoulder, not elsewhere classified: Secondary | ICD-10-CM | POA: Diagnosis not present

## 2020-07-02 DIAGNOSIS — R2681 Unsteadiness on feet: Secondary | ICD-10-CM | POA: Diagnosis not present

## 2020-07-02 DIAGNOSIS — M25511 Pain in right shoulder: Secondary | ICD-10-CM | POA: Diagnosis not present

## 2020-07-02 DIAGNOSIS — M6281 Muscle weakness (generalized): Secondary | ICD-10-CM | POA: Diagnosis not present

## 2020-07-02 NOTE — Therapy (Signed)
[facility] Outpatient Rehabilitation [facility] [address]  [address] [place], [place], [address] Phone: [phone]   Fax:  [phone]  Physical Therapy Treatment  Patient Details  Name: Melinda Graham MRN: [id number] Date of Birth: [birth year] Referring Provider (PT): Clare Gandy, MD   Encounter Date: 07/02/2020   PT End of Session - 07/02/20 1447    Visit Number 4    Number of Visits 13    Date for PT Re-Evaluation 07/31/20    Authorization Type BCBS    Authorization - Visit Number 4    Authorization - Number of Visits 30    PT Start Time 1402    PT Stop Time 1445    PT Time Calculation (min) 43 min    Activity Tolerance Patient tolerated treatment well;Patient limited by pain    Behavior During Therapy [facility] for tasks assessed/performed           History reviewed. No pertinent past medical history.  History reviewed. No pertinent surgical history.  There were no vitals filed for this visit.   Subjective Assessment - 07/02/20 1403    Subjective Arm has been doiong good- getting better.    Pertinent History none    Diagnostic tests 06/06/20 R shoulder xray: Comminuted fracture of the surgical neck of the right proximal humerus extending into the greater tuberosity with fragmentation of the greater tuberosity without significant displacement    Patient Stated Goals decrease pain    Currently in Pain? No/denies                             [facility] Adult PT Treatment/Exercise - 07/02/20 0001      Neuro Re-ed    Neuro Re-ed Details  STS on foam 5x EC, 5x EC; forward/backward stepping on foam 10x each foot; practice with R/L SLS with cues for TKE   more challenge on R     Shoulder Exercises: Supine   Flexion AAROM;AROM;Right;5 reps    Flexion Limitations 5x AAROM with wand, 5x AROM   to patient's tolerance; pillow under R arm   ABduction AAROM;Right;5 reps    ABduction Limitations with wand; cues for proper movement pattern        Shoulder Exercises: Sidelying   External Rotation AROM;Right;10 reps    External Rotation Limitations shoulder in neutral; ROM to tolerance    ABduction AROM;Right;5 reps    ABduction Limitations thumb up   to tolerance     Shoulder Exercises: Standing   Row Strengthening;Both;10 reps;Theraband    Theraband Level (Shoulder Row) Level 1 (Yellow)    Row Limitations 2x10; cues to stop at neutral      Shoulder Exercises: Pulleys   Flexion 3 minutes    Flexion Limitations cues to avoid painful arc    Scaption 3 minutes    Scaption Limitations cues to avoid pianful arc                  PT Education - 07/02/20 1447    Education Details update to HEP; edu on use of over the door pulley for home use    Person(s) Educated Patient    Methods Explanation;Demonstration;Tactile cues;Verbal cues;Handout    Comprehension Verbalized understanding;Returned demonstration            PT Short Term Goals - 07/02/20 1448      PT SHORT TERM GOAL #1   Title Patient to be independent with initial HEP.  Time 2    Period Weeks    Status Achieved    Target Date 07/03/20             PT Long Term Goals - 06/26/20 1116      PT LONG TERM GOAL #1   Title Patient to be independent with advanced HEP.    Time 6    Period Weeks    Status On-going      PT LONG TERM GOAL #2   Title Patient to demonstrate R shoulder AROM/PROM WFL and without pain limiting.    Time 6    Period Weeks    Status On-going      PT LONG TERM GOAL #3   Title Patient to demonstrate R shoulder strength >/=4+/5.    Time 6    Period Weeks    Status On-going      PT LONG TERM GOAL #4   Title Patient to lift 5lbs to overhead shelf with R UE with 0/10 pain and good scapular mechanics.    Time 6    Period Weeks    Status On-going      PT LONG TERM GOAL #5   Title Patient to score >19/24 on DGI to decrease risk of falls.    Time 6    Period Weeks    Status On-going                 Plan -  07/02/20 1448    Clinical Impression Statement Patient reporting improvement in R shoulder function since starting therapy. Patient still demonstrating limited ROM with supine AAROM with wand, compared to slightly improved ROM using pulley. Thus, educated patient on use of over-the door pulley for home use for max ROM improvement. Patient able to tolerate initiation of supine and sidelying AROM with good tolerance of flexion and ER, slightly more difficulty with abduction. Worked on dynamic and static standing balance with patient demonstrating slightly more difficulty maintaining balance when stabilizing with R LE. Updated HEP with exercises that were well-tolerated today. Patient reported understanding and without complaints at end of session.    Rehab Potential Good    PT Frequency 2x / week    PT Duration 6 weeks    PT Treatment/Interventions ADLs/Self Care Home Management;Canalith Repostioning;Cryotherapy;Electrical Stimulation;Moist Heat;Balance training;Therapeutic exercise;Therapeutic activities;Functional mobility training;Stair training;Gait training;Ultrasound;Neuromuscular re-education;Patient/family education;Manual techniques;Vasopneumatic Device;Vestibular;Taping;Energy conservation;Dry needling;Passive range of motion    PT Next Visit Plan progress shoulder AAROM/AROM; gentle periscapular strengthening    Consulted and Agree with Plan of Care Patient           Patient will benefit from skilled therapeutic intervention in order to improve the following deficits and impairments:  Hypomobility, Increased edema, Decreased activity tolerance, Decreased strength, Increased fascial restricitons, Impaired UE functional use, Pain, Decreased balance, Difficulty walking, Increased muscle spasms, Improper body mechanics, Dizziness, Postural dysfunction, Impaired flexibility  Visit Diagnosis: Acute pain of right shoulder  Stiffness of right shoulder, not elsewhere classified  Muscle weakness  (generalized)  Unsteadiness on feet     Problem List Patient Active Problem List   Diagnosis Date Noted  . Closed fracture of right proximal humerus 05/08/2020     Anette Guarneri, PT, DPT 07/02/20 2:50 PM   [facility] Outpatient Rehabilitation [facility] [address]  [address] [place], [place], [address] Phone: [phone]   Fax:  [phone]  Name: Melinda Graham MRN: [id number] Date of Birth: 04-Dec-1965

## 2020-07-04 ENCOUNTER — Encounter: Payer: BC Managed Care – PPO | Admitting: Physical Therapy

## 2020-07-09 ENCOUNTER — Other Ambulatory Visit: Payer: Self-pay

## 2020-07-09 ENCOUNTER — Ambulatory Visit: Payer: BC Managed Care – PPO | Attending: Family Medicine

## 2020-07-09 DIAGNOSIS — M6281 Muscle weakness (generalized): Secondary | ICD-10-CM | POA: Insufficient documentation

## 2020-07-09 DIAGNOSIS — M25511 Pain in right shoulder: Secondary | ICD-10-CM | POA: Diagnosis not present

## 2020-07-09 DIAGNOSIS — M25611 Stiffness of right shoulder, not elsewhere classified: Secondary | ICD-10-CM | POA: Insufficient documentation

## 2020-07-09 DIAGNOSIS — R2681 Unsteadiness on feet: Secondary | ICD-10-CM | POA: Diagnosis not present

## 2020-07-09 NOTE — Therapy (Addendum)
[facility] [address]  [place], [place], [address] Phone: [phone]   Fax:  [phone]  Physical Therapy Treatment  Patient Details  Name: Melinda Graham MRN: [id number] Date of Birth: 12/27/1965 Referring Provider (PT): Clearance Coots, MD   Encounter Date: 07/09/2020   PT End of Session - 07/09/20 1329    Visit Number 5    Number of Visits 13    Date for PT Re-Evaluation 07/31/20    Authorization Type [employer]    Authorization - Visit Number 5    Authorization - Number of Visits 30    PT Start Time 9242   Pt. arrived late to session thus tx time limit3d   PT Stop Time 1403    PT Time Calculation (min) 39 min    Activity Tolerance Patient tolerated treatment well;Patient limited by pain    Behavior During Therapy [facility] for tasks assessed/performed           History reviewed. No pertinent past medical history.  History reviewed. No pertinent surgical history.  There were no vitals filed for this visit.   Subjective Assessment - 07/09/20 1340    Subjective Pt. noting improvement since starting therapy.    Pertinent History none    Diagnostic tests 06/06/20 R shoulder xray: Comminuted fracture of the surgical neck of the right proximal humerus extending into the greater tuberosity with fragmentation of the greater tuberosity without significant displacement    Patient Stated Goals decrease pain    Currently in Pain? No/denies    Pain Score 0-No pain   "pain isn't bad really" - some pain when reaching in microwave   Pain Location Shoulder                             [facility] Adult PT Treatment/Exercise - 07/09/20 0001      Shoulder Exercises: Prone   Flexion Right;AROM   x 2 - terminated after difficulty    Flexion Limitations tactile cueing for scapular squeeze     Extension Right;15 reps;AROM    Extension Limitations tactile cueing for scapular retraction       Shoulder Exercises:  Standing   Row 15 reps;Theraband    Theraband Level (Shoulder Row) Level 1 (Yellow)    Row Limitations tactile cueing       Shoulder Exercises: Pulleys   Flexion 3 minutes    Scaption 3 minutes      Shoulder Exercises: ROM/Strengthening   Cybex Row 15 reps    Cybex Row Limitations 10# - low row                     PT Short Term Goals - 07/02/20 1448      PT SHORT TERM GOAL #1   Title Patient to be independent with initial HEP.    Time 2    Period Weeks    Status Achieved    Target Date 07/03/20             PT Long Term Goals - 06/26/20 1116      PT LONG TERM GOAL #1   Title Patient to be independent with advanced HEP.    Time 6    Period Weeks    Status On-going      PT LONG TERM GOAL #2   Title Patient to demonstrate R shoulder AROM/PROM WFL and without pain limiting.    Time  6    Period Weeks    Status On-going      PT LONG TERM GOAL #3   Title Patient to demonstrate R shoulder strength >/=4+/5.    Time 6    Period Weeks    Status On-going      PT LONG TERM GOAL #4   Title Patient to lift 5lbs to overhead shelf with R UE with 0/10 pain and good scapular mechanics.    Time 6    Period Weeks    Status On-going      PT LONG TERM GOAL #5   Title Patient to score >19/24 on DGI to decrease risk of falls.    Time 6    Period Weeks    Status On-going                 Plan - 07/09/20 1330    Clinical Impression Statement Pt. arrived late to session thus tx time limited.  Pt. with improved tolerance for AAROM/AROM elevation exercises today however demonstrating poor R scapulohumeral rhythm ~120 dg elevation.  Pt. unable to perform prone R shoulder "Y" elevation as she notes, "my shoulder feels stuck" - denied pain.  Session focused on periscapular strengthening for improved scapular activation and shoulder rhythm with overhead motions.  Pt. does feel significant improvement in R shoulder ROM since starting therapy.    Rehab Potential Good     PT Frequency 2x / week    PT Duration 6 weeks    PT Treatment/Interventions ADLs/Self Care Home Management;Canalith Repostioning;Cryotherapy;Electrical Stimulation;Moist Heat;Balance training;Therapeutic exercise;Therapeutic activities;Functional mobility training;Stair training;Gait training;Ultrasound;Neuromuscular re-education;Patient/family education;Manual techniques;Vasopneumatic Device;Vestibular;Taping;Energy conservation;Dry needling;Passive range of motion    Consulted and Agree with Plan of Care Patient           Patient will benefit from skilled therapeutic intervention in order to improve the following deficits and impairments:  Hypomobility, Increased edema, Decreased activity tolerance, Decreased strength, Increased fascial restricitons, Impaired UE functional use, Pain, Decreased balance, Difficulty walking, Increased muscle spasms, Improper body mechanics, Dizziness, Postural dysfunction, Impaired flexibility  Visit Diagnosis: Acute pain of right shoulder  Stiffness of right shoulder, not elsewhere classified  Muscle weakness (generalized)  Unsteadiness on feet     Problem List Patient Active Problem List   Diagnosis Date Noted  . Closed fracture of right proximal humerus 05/08/2020    [name], PTA 07/09/20 3:13 PM   [facility] [address]  [place], [place], [address] Phone: [phone]   Fax:  [phone]  Name: Melinda Graham MRN: [id number] Date of Birth: April 27, 1966  PHYSICAL THERAPY DISCHARGE SUMMARY  Visits from Start of Care: 5  Current functional level related to goals / functional outcomes: Unable to assess; patient cancelled all remaining appointments   Remaining deficits: Unable to assess   Education / Equipment: HEP  Plan: Patient agrees to discharge.  Patient goals were not met. Patient is being discharged due to the patient's request.  ?????     [name], PT, DPT 08/12/20 2:26 PM

## 2020-07-12 ENCOUNTER — Encounter: Payer: BC Managed Care – PPO | Admitting: Physical Therapy

## 2020-07-19 ENCOUNTER — Encounter: Payer: BC Managed Care – PPO | Admitting: Physical Therapy

## 2020-07-23 ENCOUNTER — Encounter: Payer: BC Managed Care – PPO | Admitting: Physical Therapy

## 2020-07-26 ENCOUNTER — Encounter: Payer: BC Managed Care – PPO | Admitting: Physical Therapy

## 2020-07-29 ENCOUNTER — Encounter (HOSPITAL_BASED_OUTPATIENT_CLINIC_OR_DEPARTMENT_OTHER): Payer: BC Managed Care – PPO

## 2020-07-29 DIAGNOSIS — Z1231 Encounter for screening mammogram for malignant neoplasm of breast: Secondary | ICD-10-CM

## 2020-07-30 ENCOUNTER — Ambulatory Visit: Payer: BC Managed Care – PPO | Admitting: Physical Therapy

## 2020-08-08 ENCOUNTER — Other Ambulatory Visit: Payer: Self-pay | Admitting: Family Medicine

## 2020-08-08 DIAGNOSIS — Z1231 Encounter for screening mammogram for malignant neoplasm of breast: Secondary | ICD-10-CM

## 2020-09-10 DIAGNOSIS — F419 Anxiety disorder, unspecified: Secondary | ICD-10-CM | POA: Diagnosis not present

## 2020-09-10 DIAGNOSIS — G4700 Insomnia, unspecified: Secondary | ICD-10-CM | POA: Diagnosis not present

## 2020-09-10 DIAGNOSIS — M85821 Other specified disorders of bone density and structure, right upper arm: Secondary | ICD-10-CM | POA: Diagnosis not present

## 2020-09-10 DIAGNOSIS — E7800 Pure hypercholesterolemia, unspecified: Secondary | ICD-10-CM | POA: Diagnosis not present

## 2020-09-10 DIAGNOSIS — Z79899 Other long term (current) drug therapy: Secondary | ICD-10-CM | POA: Diagnosis not present

## 2020-09-10 DIAGNOSIS — I700 Atherosclerosis of aorta: Secondary | ICD-10-CM | POA: Diagnosis not present

## 2020-09-19 DIAGNOSIS — S42201A Unspecified fracture of upper end of right humerus, initial encounter for closed fracture: Secondary | ICD-10-CM | POA: Diagnosis not present

## 2020-09-19 DIAGNOSIS — Z1231 Encounter for screening mammogram for malignant neoplasm of breast: Secondary | ICD-10-CM | POA: Diagnosis not present

## 2020-09-19 DIAGNOSIS — Z1151 Encounter for screening for human papillomavirus (HPV): Secondary | ICD-10-CM | POA: Diagnosis not present

## 2020-09-19 DIAGNOSIS — Z124 Encounter for screening for malignant neoplasm of cervix: Secondary | ICD-10-CM | POA: Diagnosis not present

## 2020-09-19 DIAGNOSIS — Z01419 Encounter for gynecological examination (general) (routine) without abnormal findings: Secondary | ICD-10-CM | POA: Diagnosis not present

## 2020-09-19 DIAGNOSIS — Z113 Encounter for screening for infections with a predominantly sexual mode of transmission: Secondary | ICD-10-CM | POA: Diagnosis not present

## 2020-09-25 ENCOUNTER — Other Ambulatory Visit: Payer: Self-pay | Admitting: Obstetrics and Gynecology

## 2020-09-25 DIAGNOSIS — S42201A Unspecified fracture of upper end of right humerus, initial encounter for closed fracture: Secondary | ICD-10-CM

## 2020-12-27 DIAGNOSIS — Z20822 Contact with and (suspected) exposure to covid-19: Secondary | ICD-10-CM | POA: Diagnosis not present

## 2021-01-23 ENCOUNTER — Other Ambulatory Visit: Payer: BC Managed Care – PPO

## 2021-02-21 DIAGNOSIS — R030 Elevated blood-pressure reading, without diagnosis of hypertension: Secondary | ICD-10-CM | POA: Diagnosis not present

## 2021-03-24 DIAGNOSIS — I10 Essential (primary) hypertension: Secondary | ICD-10-CM | POA: Diagnosis not present

## 2021-03-24 DIAGNOSIS — F419 Anxiety disorder, unspecified: Secondary | ICD-10-CM | POA: Diagnosis not present

## 2021-03-24 DIAGNOSIS — E7800 Pure hypercholesterolemia, unspecified: Secondary | ICD-10-CM | POA: Diagnosis not present

## 2021-03-24 DIAGNOSIS — G4700 Insomnia, unspecified: Secondary | ICD-10-CM | POA: Diagnosis not present

## 2021-06-13 DIAGNOSIS — F419 Anxiety disorder, unspecified: Secondary | ICD-10-CM | POA: Diagnosis not present

## 2021-06-13 DIAGNOSIS — G4700 Insomnia, unspecified: Secondary | ICD-10-CM | POA: Diagnosis not present

## 2021-06-13 DIAGNOSIS — I6523 Occlusion and stenosis of bilateral carotid arteries: Secondary | ICD-10-CM | POA: Diagnosis not present

## 2021-06-13 DIAGNOSIS — E7800 Pure hypercholesterolemia, unspecified: Secondary | ICD-10-CM | POA: Diagnosis not present

## 2021-07-08 ENCOUNTER — Other Ambulatory Visit: Payer: Self-pay | Admitting: Obstetrics and Gynecology

## 2021-07-08 ENCOUNTER — Other Ambulatory Visit: Payer: BC Managed Care – PPO

## 2021-07-08 DIAGNOSIS — S42201A Unspecified fracture of upper end of right humerus, initial encounter for closed fracture: Secondary | ICD-10-CM

## 2021-09-23 IMAGING — CR DG SHOULDER 2+V*R*
3 series · 3 of 3 positions shown · non-contrast
Comparison: 05/03/2020

CLINICAL DATA: Follow-up right humerus fracture.

EXAM:
RIGHT SHOULDER - 2+ VIEW

[w shoulder grashey right]
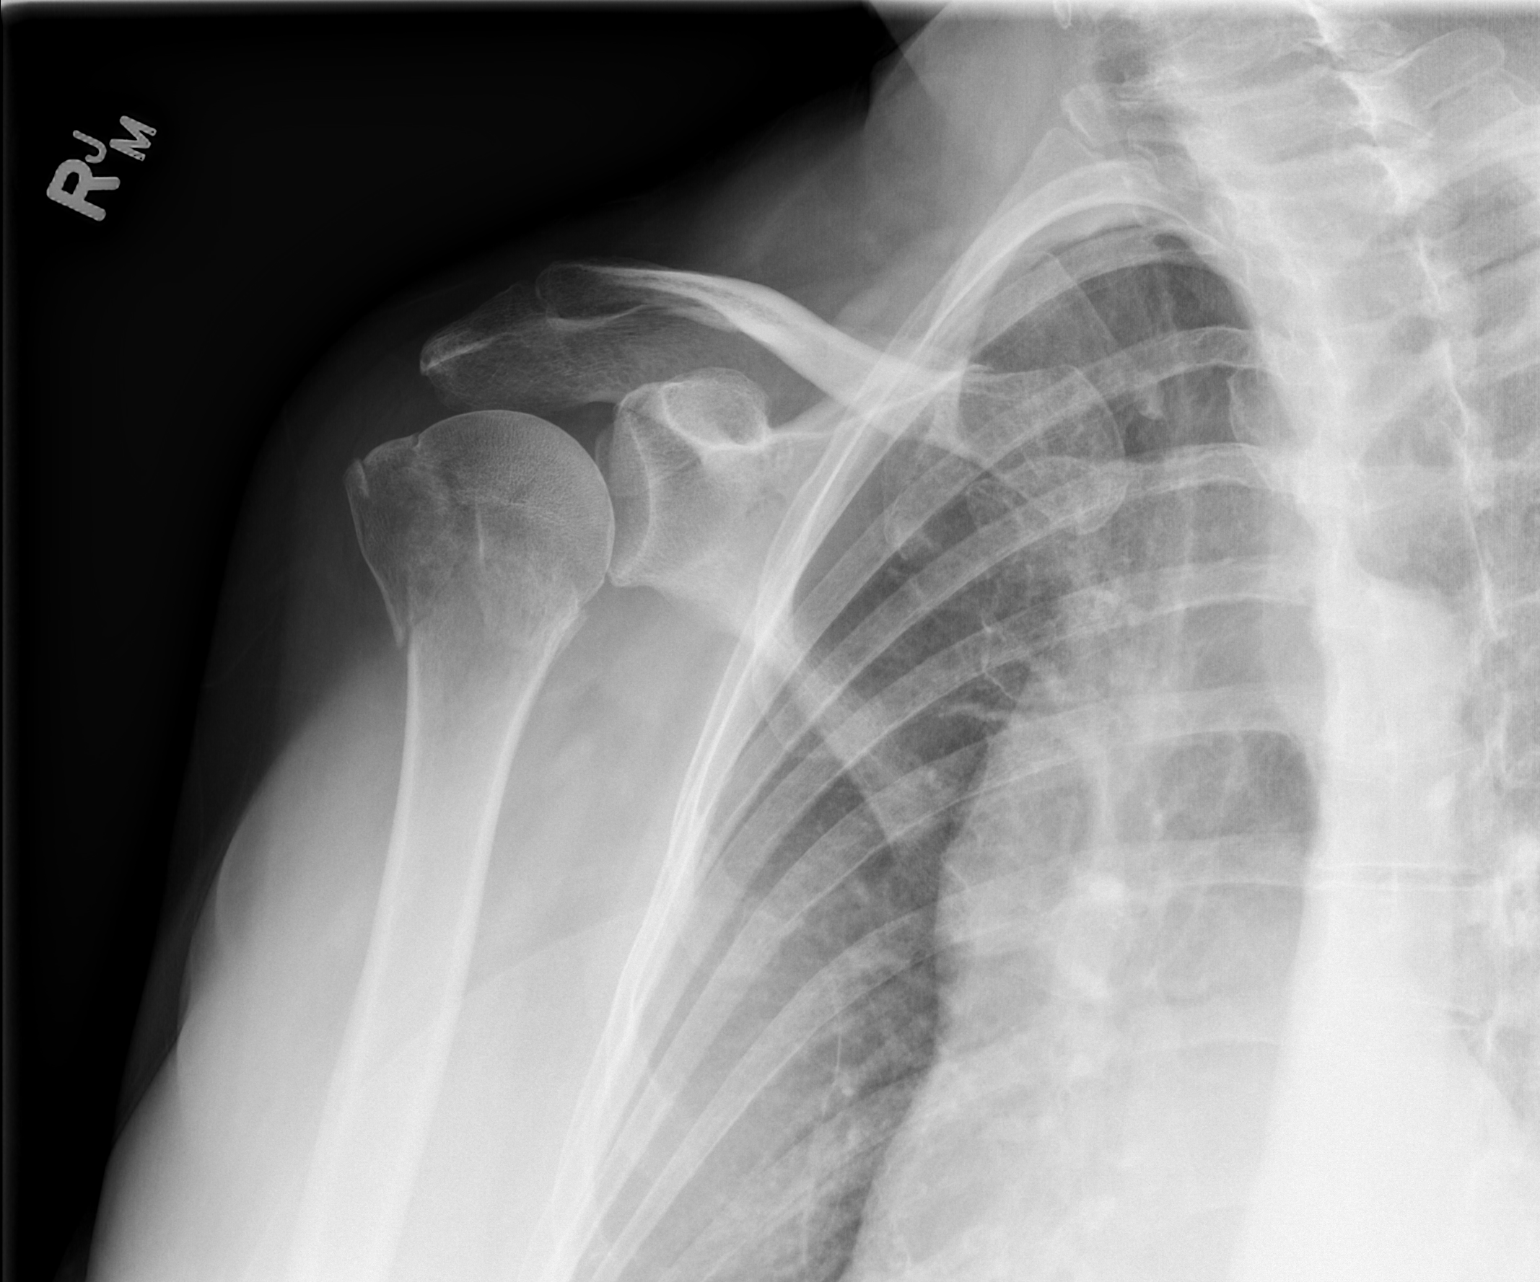

[w shoulder y view right]
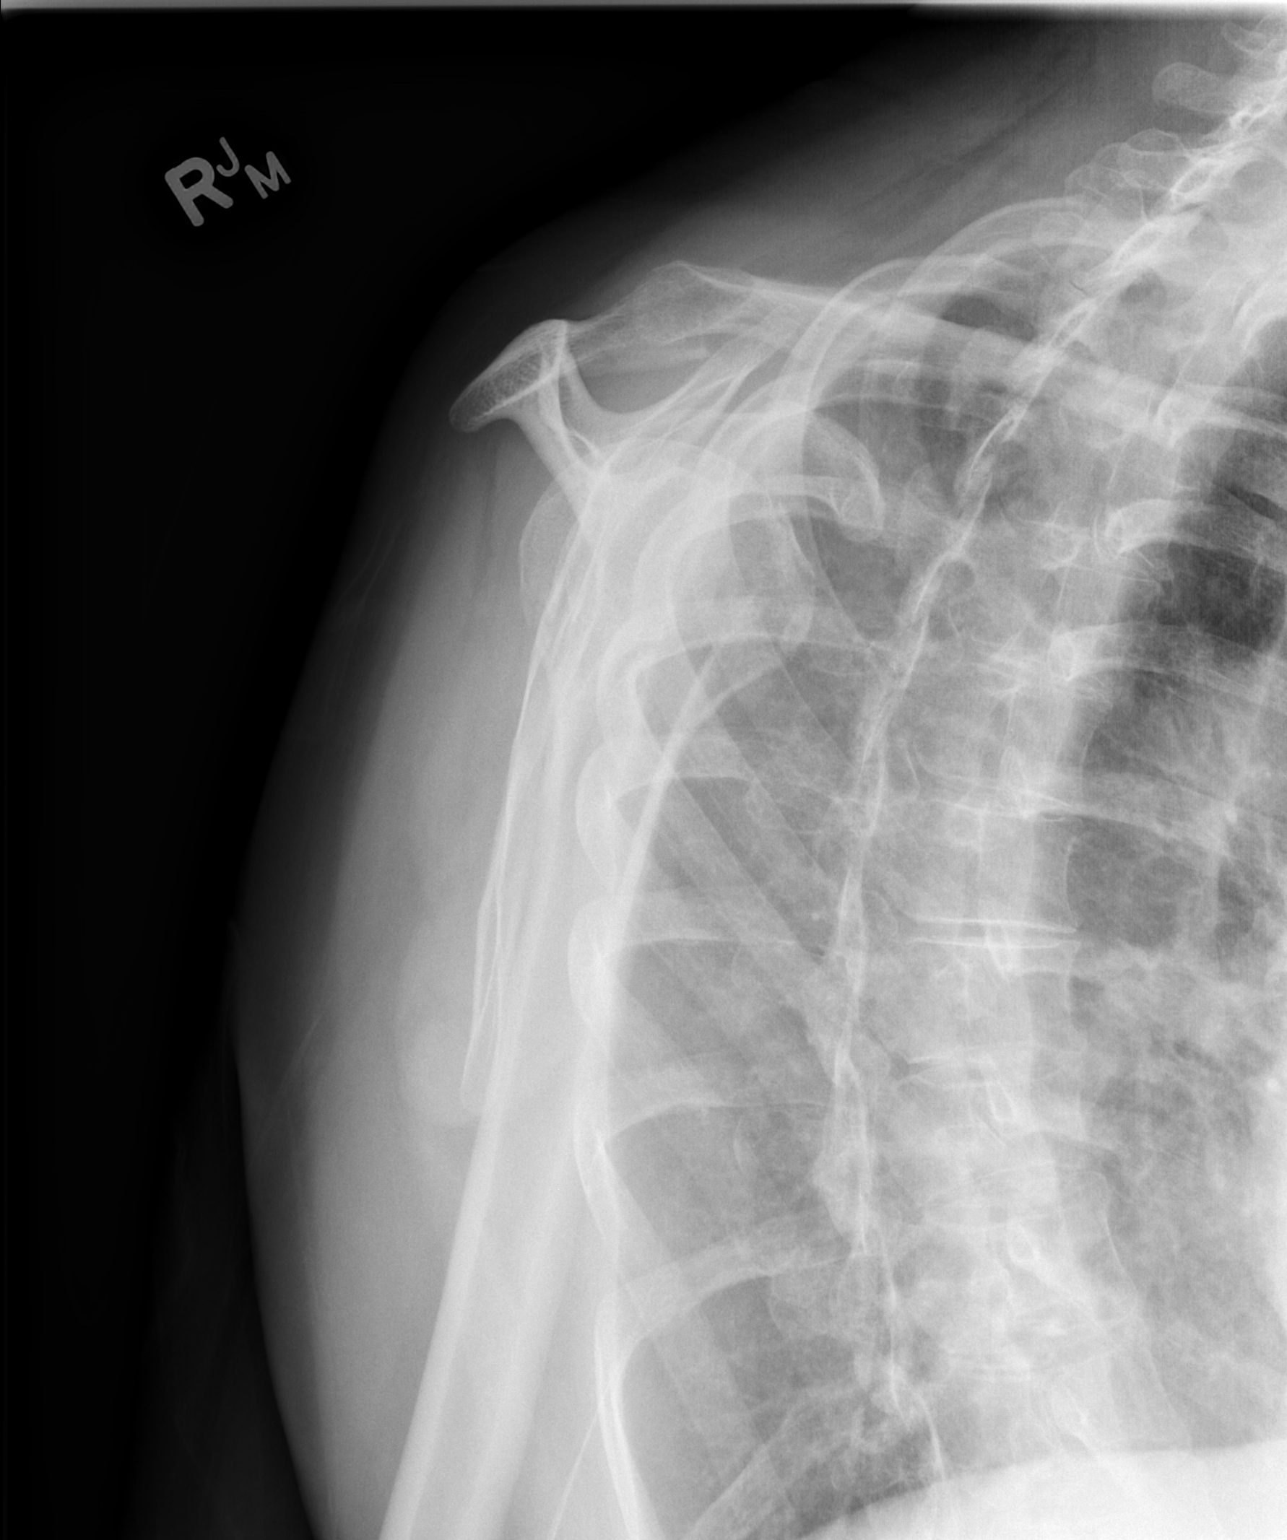

[w shoulder ap external righ]
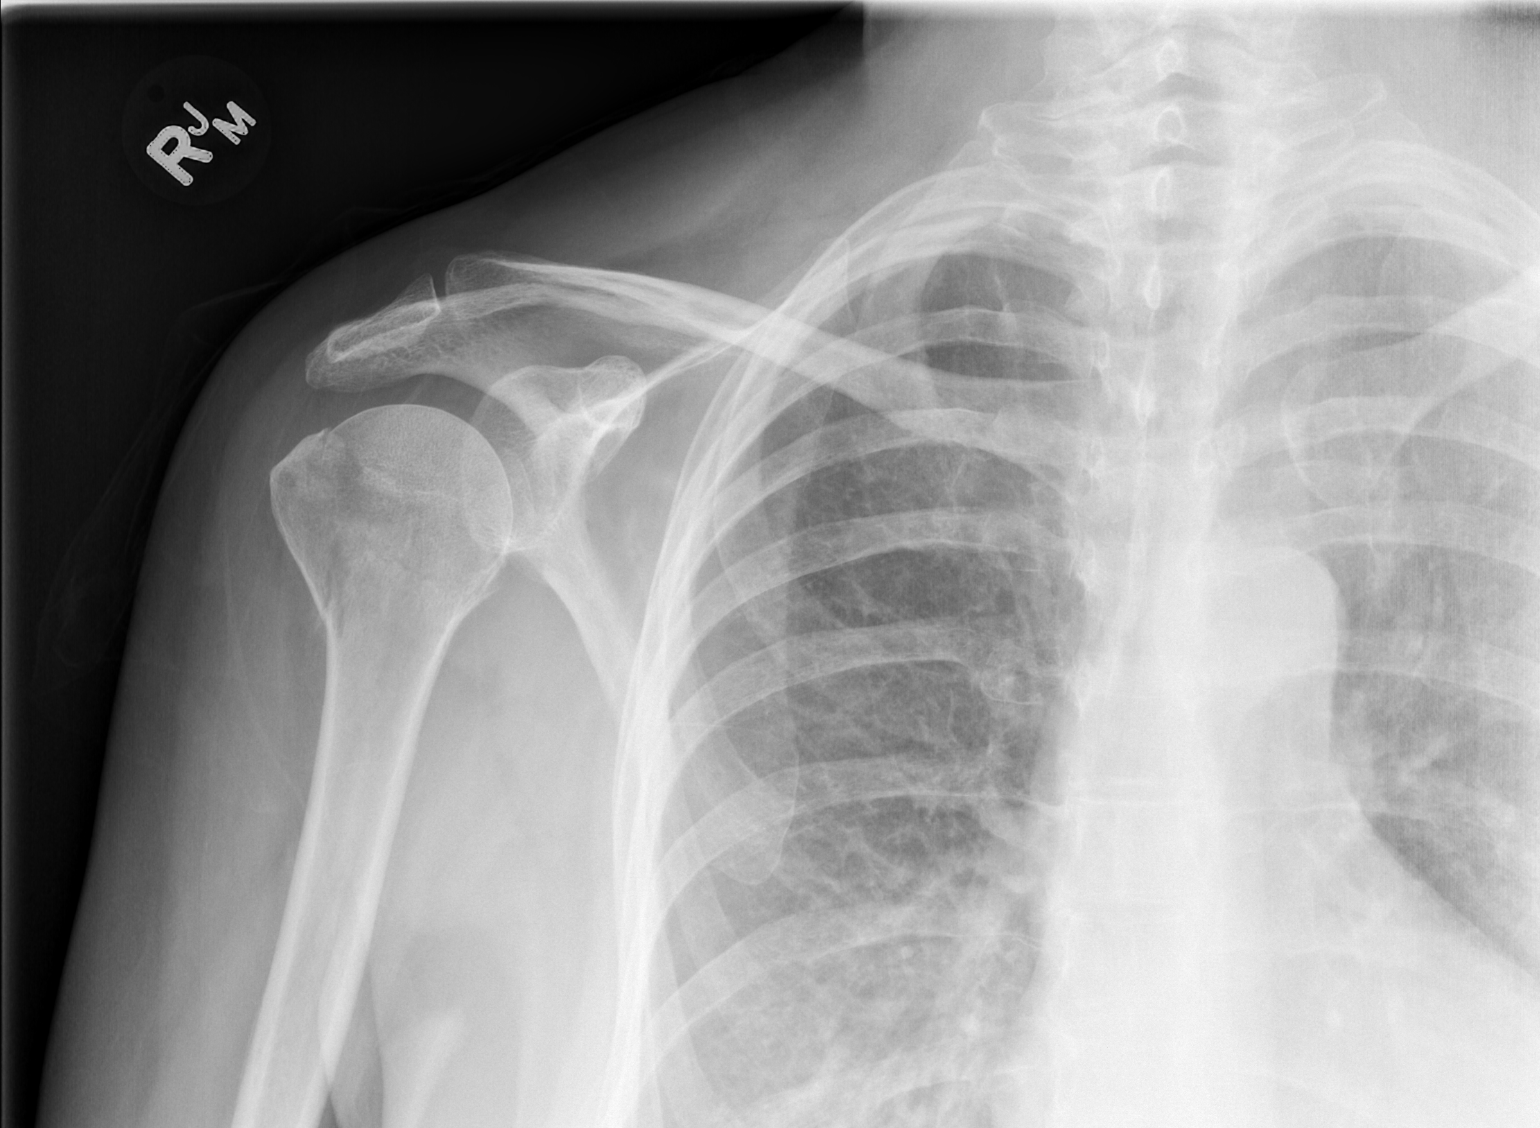

[3 of 3 positions shown; findings below may reference images not displayed]

FINDINGS: A proximal humerus fracture is again noted with involvement of the
greater tuberosity and neck. Position and alignment are unchanged
without significant (greater than 1 cm) displacement. There is
faintly increased density along portions of the fracture lines
without frank callus formation yet visible. There is no dislocation.
IMPRESSION: Unchanged position and alignment of proximal humerus fracture.

## 2021-11-17 DIAGNOSIS — H1132 Conjunctival hemorrhage, left eye: Secondary | ICD-10-CM | POA: Diagnosis not present

## 2021-12-05 ENCOUNTER — Other Ambulatory Visit: Payer: BC Managed Care – PPO

## 2021-12-22 DIAGNOSIS — E663 Overweight: Secondary | ICD-10-CM | POA: Diagnosis not present

## 2021-12-22 DIAGNOSIS — E7800 Pure hypercholesterolemia, unspecified: Secondary | ICD-10-CM | POA: Diagnosis not present

## 2021-12-22 DIAGNOSIS — G4700 Insomnia, unspecified: Secondary | ICD-10-CM | POA: Diagnosis not present

## 2021-12-22 DIAGNOSIS — F419 Anxiety disorder, unspecified: Secondary | ICD-10-CM | POA: Diagnosis not present

## 2022-05-07 DIAGNOSIS — S99912A Unspecified injury of left ankle, initial encounter: Secondary | ICD-10-CM | POA: Diagnosis not present

## 2022-06-22 DIAGNOSIS — I700 Atherosclerosis of aorta: Secondary | ICD-10-CM | POA: Diagnosis not present

## 2022-06-22 DIAGNOSIS — F419 Anxiety disorder, unspecified: Secondary | ICD-10-CM | POA: Diagnosis not present

## 2022-06-22 DIAGNOSIS — G4700 Insomnia, unspecified: Secondary | ICD-10-CM | POA: Diagnosis not present

## 2022-06-22 DIAGNOSIS — S93492A Sprain of other ligament of left ankle, initial encounter: Secondary | ICD-10-CM | POA: Diagnosis not present

## 2022-06-22 DIAGNOSIS — E663 Overweight: Secondary | ICD-10-CM | POA: Diagnosis not present

## 2022-06-22 DIAGNOSIS — Z23 Encounter for immunization: Secondary | ICD-10-CM | POA: Diagnosis not present

## 2022-08-19 DIAGNOSIS — E663 Overweight: Secondary | ICD-10-CM | POA: Diagnosis not present

## 2022-08-19 DIAGNOSIS — E7800 Pure hypercholesterolemia, unspecified: Secondary | ICD-10-CM | POA: Diagnosis not present

## 2022-08-19 DIAGNOSIS — I6523 Occlusion and stenosis of bilateral carotid arteries: Secondary | ICD-10-CM | POA: Diagnosis not present

## 2022-12-21 ENCOUNTER — Encounter: Payer: Self-pay | Admitting: *Deleted

## 2023-01-08 DIAGNOSIS — G4700 Insomnia, unspecified: Secondary | ICD-10-CM | POA: Diagnosis not present

## 2023-01-08 DIAGNOSIS — N952 Postmenopausal atrophic vaginitis: Secondary | ICD-10-CM | POA: Diagnosis not present

## 2023-01-08 DIAGNOSIS — E7800 Pure hypercholesterolemia, unspecified: Secondary | ICD-10-CM | POA: Diagnosis not present

## 2023-01-08 DIAGNOSIS — F419 Anxiety disorder, unspecified: Secondary | ICD-10-CM | POA: Diagnosis not present

## 2023-03-16 DIAGNOSIS — Z1151 Encounter for screening for human papillomavirus (HPV): Secondary | ICD-10-CM | POA: Diagnosis not present

## 2023-03-16 DIAGNOSIS — Z124 Encounter for screening for malignant neoplasm of cervix: Secondary | ICD-10-CM | POA: Diagnosis not present

## 2023-03-16 DIAGNOSIS — Z01419 Encounter for gynecological examination (general) (routine) without abnormal findings: Secondary | ICD-10-CM | POA: Diagnosis not present

## 2023-03-16 DIAGNOSIS — Z1231 Encounter for screening mammogram for malignant neoplasm of breast: Secondary | ICD-10-CM | POA: Diagnosis not present

## 2023-03-16 DIAGNOSIS — Z202 Contact with and (suspected) exposure to infections with a predominantly sexual mode of transmission: Secondary | ICD-10-CM | POA: Diagnosis not present

## 2023-04-20 DIAGNOSIS — Z6826 Body mass index (BMI) 26.0-26.9, adult: Secondary | ICD-10-CM | POA: Diagnosis not present

## 2023-04-20 DIAGNOSIS — B3731 Acute candidiasis of vulva and vagina: Secondary | ICD-10-CM | POA: Diagnosis not present

## 2023-04-20 DIAGNOSIS — N390 Urinary tract infection, site not specified: Secondary | ICD-10-CM | POA: Diagnosis not present

## 2023-04-20 DIAGNOSIS — R300 Dysuria: Secondary | ICD-10-CM | POA: Diagnosis not present

## 2023-08-30 DIAGNOSIS — F419 Anxiety disorder, unspecified: Secondary | ICD-10-CM | POA: Diagnosis not present

## 2023-08-30 DIAGNOSIS — Z79899 Other long term (current) drug therapy: Secondary | ICD-10-CM | POA: Diagnosis not present

## 2023-08-30 DIAGNOSIS — M47816 Spondylosis without myelopathy or radiculopathy, lumbar region: Secondary | ICD-10-CM | POA: Diagnosis not present

## 2023-08-30 DIAGNOSIS — R7303 Prediabetes: Secondary | ICD-10-CM | POA: Diagnosis not present

## 2023-08-30 DIAGNOSIS — Z23 Encounter for immunization: Secondary | ICD-10-CM | POA: Diagnosis not present

## 2023-08-30 DIAGNOSIS — G4700 Insomnia, unspecified: Secondary | ICD-10-CM | POA: Diagnosis not present

## 2023-10-14 DIAGNOSIS — Z0000 Encounter for general adult medical examination without abnormal findings: Secondary | ICD-10-CM | POA: Diagnosis not present

## 2024-03-23 ENCOUNTER — Other Ambulatory Visit (HOSPITAL_BASED_OUTPATIENT_CLINIC_OR_DEPARTMENT_OTHER): Payer: Self-pay | Admitting: Obstetrics and Gynecology

## 2024-03-23 DIAGNOSIS — Z01411 Encounter for gynecological examination (general) (routine) with abnormal findings: Secondary | ICD-10-CM | POA: Diagnosis not present

## 2024-03-23 DIAGNOSIS — Z113 Encounter for screening for infections with a predominantly sexual mode of transmission: Secondary | ICD-10-CM | POA: Diagnosis not present

## 2024-03-23 DIAGNOSIS — Z1151 Encounter for screening for human papillomavirus (HPV): Secondary | ICD-10-CM | POA: Diagnosis not present

## 2024-03-23 DIAGNOSIS — Z1231 Encounter for screening mammogram for malignant neoplasm of breast: Secondary | ICD-10-CM | POA: Diagnosis not present

## 2024-03-23 DIAGNOSIS — Z202 Contact with and (suspected) exposure to infections with a predominantly sexual mode of transmission: Secondary | ICD-10-CM | POA: Diagnosis not present

## 2024-03-23 DIAGNOSIS — N898 Other specified noninflammatory disorders of vagina: Secondary | ICD-10-CM | POA: Diagnosis not present

## 2024-03-23 DIAGNOSIS — Z124 Encounter for screening for malignant neoplasm of cervix: Secondary | ICD-10-CM | POA: Diagnosis not present

## 2024-03-23 DIAGNOSIS — Z13820 Encounter for screening for osteoporosis: Secondary | ICD-10-CM

## 2024-03-23 DIAGNOSIS — Z130 Encounter for screening for diseases of the blood and blood-forming organs and certain disorders involving the immune mechanism: Secondary | ICD-10-CM | POA: Diagnosis not present

## 2024-05-09 DIAGNOSIS — G4700 Insomnia, unspecified: Secondary | ICD-10-CM | POA: Diagnosis not present

## 2024-05-09 DIAGNOSIS — M47816 Spondylosis without myelopathy or radiculopathy, lumbar region: Secondary | ICD-10-CM | POA: Diagnosis not present

## 2024-05-09 DIAGNOSIS — E7800 Pure hypercholesterolemia, unspecified: Secondary | ICD-10-CM | POA: Diagnosis not present

## 2024-05-09 DIAGNOSIS — Z23 Encounter for immunization: Secondary | ICD-10-CM | POA: Diagnosis not present

## 2024-05-09 DIAGNOSIS — F419 Anxiety disorder, unspecified: Secondary | ICD-10-CM | POA: Diagnosis not present

## 2024-06-06 DIAGNOSIS — E7800 Pure hypercholesterolemia, unspecified: Secondary | ICD-10-CM | POA: Diagnosis not present
